# Patient Record
Sex: Female | Born: 1945
Health system: Southern US, Community
[De-identification: ages and names within clinical notes are randomized; demographics above are authoritative.]

## PROBLEM LIST (undated history)

## (undated) DIAGNOSIS — E785 Hyperlipidemia, unspecified: Secondary | ICD-10-CM

## (undated) DIAGNOSIS — I10 Essential (primary) hypertension: Secondary | ICD-10-CM

## (undated) DIAGNOSIS — M810 Age-related osteoporosis without current pathological fracture: Secondary | ICD-10-CM

## (undated) HISTORY — PX: TUMOR REMOVAL: SHX12

## (undated) HISTORY — DX: Hyperlipidemia, unspecified: E78.5

## (undated) HISTORY — DX: Age-related osteoporosis without current pathological fracture: M81.0

## (undated) HISTORY — DX: Essential (primary) hypertension: I10

---

## 2000-06-13 ENCOUNTER — Ambulatory Visit (HOSPITAL_COMMUNITY): Admission: RE | Admit: 2000-06-13 | Discharge: 2000-06-13 | Payer: Self-pay | Admitting: Gastroenterology

## 2010-04-30 HISTORY — PX: COLONOSCOPY: SHX174

## 2011-05-24 DIAGNOSIS — Z1231 Encounter for screening mammogram for malignant neoplasm of breast: Secondary | ICD-10-CM | POA: Diagnosis not present

## 2011-05-24 DIAGNOSIS — Z0000 Encounter for general adult medical examination without abnormal findings: Secondary | ICD-10-CM | POA: Diagnosis not present

## 2011-05-24 DIAGNOSIS — Z1210 Encounter for screening for malignant neoplasm of intestinal tract, unspecified: Secondary | ICD-10-CM | POA: Diagnosis not present

## 2011-05-24 DIAGNOSIS — Z01419 Encounter for gynecological examination (general) (routine) without abnormal findings: Secondary | ICD-10-CM | POA: Diagnosis not present

## 2011-05-29 DIAGNOSIS — Z1231 Encounter for screening mammogram for malignant neoplasm of breast: Secondary | ICD-10-CM | POA: Diagnosis not present

## 2011-07-02 DIAGNOSIS — Z0000 Encounter for general adult medical examination without abnormal findings: Secondary | ICD-10-CM | POA: Diagnosis not present

## 2011-07-25 DIAGNOSIS — Z1389 Encounter for screening for other disorder: Secondary | ICD-10-CM | POA: Diagnosis not present

## 2011-07-25 DIAGNOSIS — I10 Essential (primary) hypertension: Secondary | ICD-10-CM | POA: Diagnosis not present

## 2011-07-25 DIAGNOSIS — Z6827 Body mass index (BMI) 27.0-27.9, adult: Secondary | ICD-10-CM | POA: Diagnosis not present

## 2011-08-20 DIAGNOSIS — I10 Essential (primary) hypertension: Secondary | ICD-10-CM | POA: Diagnosis not present

## 2011-08-20 DIAGNOSIS — Z23 Encounter for immunization: Secondary | ICD-10-CM | POA: Diagnosis not present

## 2011-08-20 DIAGNOSIS — E782 Mixed hyperlipidemia: Secondary | ICD-10-CM | POA: Diagnosis not present

## 2012-01-16 DIAGNOSIS — E782 Mixed hyperlipidemia: Secondary | ICD-10-CM | POA: Diagnosis not present

## 2012-01-16 DIAGNOSIS — B009 Herpesviral infection, unspecified: Secondary | ICD-10-CM | POA: Diagnosis not present

## 2012-01-16 DIAGNOSIS — I10 Essential (primary) hypertension: Secondary | ICD-10-CM | POA: Diagnosis not present

## 2012-01-28 DIAGNOSIS — Z23 Encounter for immunization: Secondary | ICD-10-CM | POA: Diagnosis not present

## 2012-05-14 DIAGNOSIS — I10 Essential (primary) hypertension: Secondary | ICD-10-CM | POA: Diagnosis not present

## 2012-05-14 DIAGNOSIS — E782 Mixed hyperlipidemia: Secondary | ICD-10-CM | POA: Diagnosis not present

## 2012-05-14 DIAGNOSIS — E559 Vitamin D deficiency, unspecified: Secondary | ICD-10-CM | POA: Diagnosis not present

## 2012-05-14 DIAGNOSIS — L2089 Other atopic dermatitis: Secondary | ICD-10-CM | POA: Diagnosis not present

## 2012-05-20 DIAGNOSIS — M810 Age-related osteoporosis without current pathological fracture: Secondary | ICD-10-CM | POA: Diagnosis not present

## 2012-05-20 DIAGNOSIS — Z1231 Encounter for screening mammogram for malignant neoplasm of breast: Secondary | ICD-10-CM | POA: Diagnosis not present

## 2012-05-20 DIAGNOSIS — Z1210 Encounter for screening for malignant neoplasm of intestinal tract, unspecified: Secondary | ICD-10-CM | POA: Diagnosis not present

## 2012-05-20 DIAGNOSIS — Z01419 Encounter for gynecological examination (general) (routine) without abnormal findings: Secondary | ICD-10-CM | POA: Diagnosis not present

## 2012-05-20 DIAGNOSIS — N393 Stress incontinence (female) (male): Secondary | ICD-10-CM | POA: Diagnosis not present

## 2012-05-20 DIAGNOSIS — I10 Essential (primary) hypertension: Secondary | ICD-10-CM | POA: Diagnosis not present

## 2012-05-26 DIAGNOSIS — R05 Cough: Secondary | ICD-10-CM | POA: Diagnosis not present

## 2012-05-30 DIAGNOSIS — Z13820 Encounter for screening for osteoporosis: Secondary | ICD-10-CM | POA: Diagnosis not present

## 2012-05-30 DIAGNOSIS — M810 Age-related osteoporosis without current pathological fracture: Secondary | ICD-10-CM | POA: Diagnosis not present

## 2012-05-30 DIAGNOSIS — Z1231 Encounter for screening mammogram for malignant neoplasm of breast: Secondary | ICD-10-CM | POA: Diagnosis not present

## 2012-06-18 DIAGNOSIS — I10 Essential (primary) hypertension: Secondary | ICD-10-CM | POA: Diagnosis not present

## 2012-07-21 DIAGNOSIS — Q120 Congenital cataract: Secondary | ICD-10-CM | POA: Diagnosis not present

## 2012-11-12 DIAGNOSIS — E782 Mixed hyperlipidemia: Secondary | ICD-10-CM | POA: Diagnosis not present

## 2012-11-12 DIAGNOSIS — Z1331 Encounter for screening for depression: Secondary | ICD-10-CM | POA: Diagnosis not present

## 2012-11-12 DIAGNOSIS — Z139 Encounter for screening, unspecified: Secondary | ICD-10-CM | POA: Diagnosis not present

## 2012-11-12 DIAGNOSIS — I10 Essential (primary) hypertension: Secondary | ICD-10-CM | POA: Diagnosis not present

## 2013-02-03 DIAGNOSIS — Z79899 Other long term (current) drug therapy: Secondary | ICD-10-CM | POA: Diagnosis not present

## 2013-02-03 DIAGNOSIS — Z23 Encounter for immunization: Secondary | ICD-10-CM | POA: Diagnosis not present

## 2013-03-11 DIAGNOSIS — N182 Chronic kidney disease, stage 2 (mild): Secondary | ICD-10-CM | POA: Diagnosis not present

## 2013-03-11 DIAGNOSIS — Z1211 Encounter for screening for malignant neoplasm of colon: Secondary | ICD-10-CM | POA: Diagnosis not present

## 2013-03-11 DIAGNOSIS — I129 Hypertensive chronic kidney disease with stage 1 through stage 4 chronic kidney disease, or unspecified chronic kidney disease: Secondary | ICD-10-CM | POA: Diagnosis not present

## 2013-03-11 DIAGNOSIS — Z0000 Encounter for general adult medical examination without abnormal findings: Secondary | ICD-10-CM | POA: Diagnosis not present

## 2013-03-11 DIAGNOSIS — Z01419 Encounter for gynecological examination (general) (routine) without abnormal findings: Secondary | ICD-10-CM | POA: Diagnosis not present

## 2013-03-25 DIAGNOSIS — M810 Age-related osteoporosis without current pathological fracture: Secondary | ICD-10-CM | POA: Diagnosis not present

## 2013-06-03 DIAGNOSIS — Z1231 Encounter for screening mammogram for malignant neoplasm of breast: Secondary | ICD-10-CM | POA: Diagnosis not present

## 2013-06-03 DIAGNOSIS — R928 Other abnormal and inconclusive findings on diagnostic imaging of breast: Secondary | ICD-10-CM | POA: Diagnosis not present

## 2013-06-08 DIAGNOSIS — Z6827 Body mass index (BMI) 27.0-27.9, adult: Secondary | ICD-10-CM | POA: Diagnosis not present

## 2013-06-08 DIAGNOSIS — M67919 Unspecified disorder of synovium and tendon, unspecified shoulder: Secondary | ICD-10-CM | POA: Diagnosis not present

## 2013-06-08 DIAGNOSIS — N63 Unspecified lump in unspecified breast: Secondary | ICD-10-CM | POA: Diagnosis not present

## 2013-06-08 DIAGNOSIS — R928 Other abnormal and inconclusive findings on diagnostic imaging of breast: Secondary | ICD-10-CM | POA: Diagnosis not present

## 2013-06-11 DIAGNOSIS — I129 Hypertensive chronic kidney disease with stage 1 through stage 4 chronic kidney disease, or unspecified chronic kidney disease: Secondary | ICD-10-CM | POA: Diagnosis not present

## 2013-06-11 DIAGNOSIS — Z0000 Encounter for general adult medical examination without abnormal findings: Secondary | ICD-10-CM | POA: Diagnosis not present

## 2013-06-11 DIAGNOSIS — N182 Chronic kidney disease, stage 2 (mild): Secondary | ICD-10-CM | POA: Diagnosis not present

## 2013-06-11 DIAGNOSIS — M719 Bursopathy, unspecified: Secondary | ICD-10-CM | POA: Diagnosis not present

## 2013-06-11 DIAGNOSIS — Z1331 Encounter for screening for depression: Secondary | ICD-10-CM | POA: Diagnosis not present

## 2013-06-11 DIAGNOSIS — E782 Mixed hyperlipidemia: Secondary | ICD-10-CM | POA: Diagnosis not present

## 2013-06-11 DIAGNOSIS — Z139 Encounter for screening, unspecified: Secondary | ICD-10-CM | POA: Diagnosis not present

## 2013-06-11 DIAGNOSIS — M25519 Pain in unspecified shoulder: Secondary | ICD-10-CM | POA: Diagnosis not present

## 2013-06-11 DIAGNOSIS — M6281 Muscle weakness (generalized): Secondary | ICD-10-CM | POA: Diagnosis not present

## 2013-06-11 DIAGNOSIS — M67919 Unspecified disorder of synovium and tendon, unspecified shoulder: Secondary | ICD-10-CM | POA: Diagnosis not present

## 2013-06-19 DIAGNOSIS — M67919 Unspecified disorder of synovium and tendon, unspecified shoulder: Secondary | ICD-10-CM | POA: Diagnosis not present

## 2013-06-19 DIAGNOSIS — M25519 Pain in unspecified shoulder: Secondary | ICD-10-CM | POA: Diagnosis not present

## 2013-06-19 DIAGNOSIS — M6281 Muscle weakness (generalized): Secondary | ICD-10-CM | POA: Diagnosis not present

## 2013-06-24 DIAGNOSIS — M25519 Pain in unspecified shoulder: Secondary | ICD-10-CM | POA: Diagnosis not present

## 2013-06-24 DIAGNOSIS — M6281 Muscle weakness (generalized): Secondary | ICD-10-CM | POA: Diagnosis not present

## 2013-06-24 DIAGNOSIS — M67919 Unspecified disorder of synovium and tendon, unspecified shoulder: Secondary | ICD-10-CM | POA: Diagnosis not present

## 2013-06-29 DIAGNOSIS — M719 Bursopathy, unspecified: Secondary | ICD-10-CM | POA: Diagnosis not present

## 2013-06-29 DIAGNOSIS — M25519 Pain in unspecified shoulder: Secondary | ICD-10-CM | POA: Diagnosis not present

## 2013-06-29 DIAGNOSIS — M6281 Muscle weakness (generalized): Secondary | ICD-10-CM | POA: Diagnosis not present

## 2013-06-29 DIAGNOSIS — M67919 Unspecified disorder of synovium and tendon, unspecified shoulder: Secondary | ICD-10-CM | POA: Diagnosis not present

## 2013-07-03 DIAGNOSIS — M6281 Muscle weakness (generalized): Secondary | ICD-10-CM | POA: Diagnosis not present

## 2013-07-03 DIAGNOSIS — M719 Bursopathy, unspecified: Secondary | ICD-10-CM | POA: Diagnosis not present

## 2013-07-03 DIAGNOSIS — M25519 Pain in unspecified shoulder: Secondary | ICD-10-CM | POA: Diagnosis not present

## 2013-07-03 DIAGNOSIS — M67919 Unspecified disorder of synovium and tendon, unspecified shoulder: Secondary | ICD-10-CM | POA: Diagnosis not present

## 2013-07-06 DIAGNOSIS — M6281 Muscle weakness (generalized): Secondary | ICD-10-CM | POA: Diagnosis not present

## 2013-07-06 DIAGNOSIS — M67919 Unspecified disorder of synovium and tendon, unspecified shoulder: Secondary | ICD-10-CM | POA: Diagnosis not present

## 2013-07-06 DIAGNOSIS — M25519 Pain in unspecified shoulder: Secondary | ICD-10-CM | POA: Diagnosis not present

## 2013-07-06 DIAGNOSIS — M719 Bursopathy, unspecified: Secondary | ICD-10-CM | POA: Diagnosis not present

## 2013-10-01 DIAGNOSIS — N182 Chronic kidney disease, stage 2 (mild): Secondary | ICD-10-CM | POA: Diagnosis not present

## 2013-10-01 DIAGNOSIS — I129 Hypertensive chronic kidney disease with stage 1 through stage 4 chronic kidney disease, or unspecified chronic kidney disease: Secondary | ICD-10-CM | POA: Diagnosis not present

## 2013-10-01 DIAGNOSIS — E782 Mixed hyperlipidemia: Secondary | ICD-10-CM | POA: Diagnosis not present

## 2013-10-19 DIAGNOSIS — I129 Hypertensive chronic kidney disease with stage 1 through stage 4 chronic kidney disease, or unspecified chronic kidney disease: Secondary | ICD-10-CM | POA: Diagnosis not present

## 2013-10-19 DIAGNOSIS — Z6827 Body mass index (BMI) 27.0-27.9, adult: Secondary | ICD-10-CM | POA: Diagnosis not present

## 2013-10-19 DIAGNOSIS — E782 Mixed hyperlipidemia: Secondary | ICD-10-CM | POA: Diagnosis not present

## 2013-10-19 DIAGNOSIS — N182 Chronic kidney disease, stage 2 (mild): Secondary | ICD-10-CM | POA: Diagnosis not present

## 2013-11-06 DIAGNOSIS — J029 Acute pharyngitis, unspecified: Secondary | ICD-10-CM | POA: Diagnosis not present

## 2013-11-06 DIAGNOSIS — I10 Essential (primary) hypertension: Secondary | ICD-10-CM | POA: Diagnosis not present

## 2013-11-06 DIAGNOSIS — Z6827 Body mass index (BMI) 27.0-27.9, adult: Secondary | ICD-10-CM | POA: Diagnosis not present

## 2013-12-07 DIAGNOSIS — R922 Inconclusive mammogram: Secondary | ICD-10-CM | POA: Diagnosis not present

## 2013-12-07 DIAGNOSIS — R928 Other abnormal and inconclusive findings on diagnostic imaging of breast: Secondary | ICD-10-CM | POA: Diagnosis not present

## 2014-02-18 DIAGNOSIS — Z23 Encounter for immunization: Secondary | ICD-10-CM | POA: Diagnosis not present

## 2014-02-18 DIAGNOSIS — E785 Hyperlipidemia, unspecified: Secondary | ICD-10-CM | POA: Diagnosis not present

## 2014-02-18 DIAGNOSIS — I129 Hypertensive chronic kidney disease with stage 1 through stage 4 chronic kidney disease, or unspecified chronic kidney disease: Secondary | ICD-10-CM | POA: Diagnosis not present

## 2014-02-18 DIAGNOSIS — N182 Chronic kidney disease, stage 2 (mild): Secondary | ICD-10-CM | POA: Diagnosis not present

## 2014-02-25 DIAGNOSIS — I129 Hypertensive chronic kidney disease with stage 1 through stage 4 chronic kidney disease, or unspecified chronic kidney disease: Secondary | ICD-10-CM | POA: Diagnosis not present

## 2014-02-25 DIAGNOSIS — E785 Hyperlipidemia, unspecified: Secondary | ICD-10-CM | POA: Diagnosis not present

## 2014-02-25 DIAGNOSIS — Z6827 Body mass index (BMI) 27.0-27.9, adult: Secondary | ICD-10-CM | POA: Diagnosis not present

## 2014-02-25 DIAGNOSIS — N182 Chronic kidney disease, stage 2 (mild): Secondary | ICD-10-CM | POA: Diagnosis not present

## 2014-06-30 DIAGNOSIS — R928 Other abnormal and inconclusive findings on diagnostic imaging of breast: Secondary | ICD-10-CM | POA: Diagnosis not present

## 2014-06-30 DIAGNOSIS — R922 Inconclusive mammogram: Secondary | ICD-10-CM | POA: Diagnosis not present

## 2014-07-20 DIAGNOSIS — H2513 Age-related nuclear cataract, bilateral: Secondary | ICD-10-CM | POA: Diagnosis not present

## 2014-07-26 DIAGNOSIS — N182 Chronic kidney disease, stage 2 (mild): Secondary | ICD-10-CM | POA: Diagnosis not present

## 2014-07-26 DIAGNOSIS — Z6827 Body mass index (BMI) 27.0-27.9, adult: Secondary | ICD-10-CM | POA: Diagnosis not present

## 2014-07-26 DIAGNOSIS — E785 Hyperlipidemia, unspecified: Secondary | ICD-10-CM | POA: Diagnosis not present

## 2014-07-26 DIAGNOSIS — I129 Hypertensive chronic kidney disease with stage 1 through stage 4 chronic kidney disease, or unspecified chronic kidney disease: Secondary | ICD-10-CM | POA: Diagnosis not present

## 2014-10-28 DIAGNOSIS — K644 Residual hemorrhoidal skin tags: Secondary | ICD-10-CM | POA: Diagnosis not present

## 2014-10-28 DIAGNOSIS — Z6827 Body mass index (BMI) 27.0-27.9, adult: Secondary | ICD-10-CM | POA: Diagnosis not present

## 2014-11-17 DIAGNOSIS — Z139 Encounter for screening, unspecified: Secondary | ICD-10-CM | POA: Diagnosis not present

## 2014-11-17 DIAGNOSIS — Z0000 Encounter for general adult medical examination without abnormal findings: Secondary | ICD-10-CM | POA: Diagnosis not present

## 2014-11-17 DIAGNOSIS — Z1389 Encounter for screening for other disorder: Secondary | ICD-10-CM | POA: Diagnosis not present

## 2014-12-30 DIAGNOSIS — N182 Chronic kidney disease, stage 2 (mild): Secondary | ICD-10-CM | POA: Diagnosis not present

## 2014-12-30 DIAGNOSIS — E785 Hyperlipidemia, unspecified: Secondary | ICD-10-CM | POA: Diagnosis not present

## 2014-12-30 DIAGNOSIS — I129 Hypertensive chronic kidney disease with stage 1 through stage 4 chronic kidney disease, or unspecified chronic kidney disease: Secondary | ICD-10-CM | POA: Diagnosis not present

## 2014-12-30 DIAGNOSIS — K219 Gastro-esophageal reflux disease without esophagitis: Secondary | ICD-10-CM | POA: Diagnosis not present

## 2015-03-07 DIAGNOSIS — Z23 Encounter for immunization: Secondary | ICD-10-CM | POA: Diagnosis not present

## 2015-03-07 DIAGNOSIS — R200 Anesthesia of skin: Secondary | ICD-10-CM | POA: Diagnosis not present

## 2015-03-07 DIAGNOSIS — R0602 Shortness of breath: Secondary | ICD-10-CM | POA: Diagnosis not present

## 2015-03-07 DIAGNOSIS — Z6828 Body mass index (BMI) 28.0-28.9, adult: Secondary | ICD-10-CM | POA: Diagnosis not present

## 2015-05-09 DIAGNOSIS — Z6828 Body mass index (BMI) 28.0-28.9, adult: Secondary | ICD-10-CM | POA: Diagnosis not present

## 2015-05-09 DIAGNOSIS — J029 Acute pharyngitis, unspecified: Secondary | ICD-10-CM | POA: Diagnosis not present

## 2015-05-18 DIAGNOSIS — N182 Chronic kidney disease, stage 2 (mild): Secondary | ICD-10-CM | POA: Diagnosis not present

## 2015-05-18 DIAGNOSIS — E785 Hyperlipidemia, unspecified: Secondary | ICD-10-CM | POA: Diagnosis not present

## 2015-05-18 DIAGNOSIS — I129 Hypertensive chronic kidney disease with stage 1 through stage 4 chronic kidney disease, or unspecified chronic kidney disease: Secondary | ICD-10-CM | POA: Diagnosis not present

## 2015-05-27 DIAGNOSIS — E785 Hyperlipidemia, unspecified: Secondary | ICD-10-CM | POA: Diagnosis not present

## 2015-05-27 DIAGNOSIS — M810 Age-related osteoporosis without current pathological fracture: Secondary | ICD-10-CM | POA: Diagnosis not present

## 2015-05-27 DIAGNOSIS — Z6828 Body mass index (BMI) 28.0-28.9, adult: Secondary | ICD-10-CM | POA: Diagnosis not present

## 2015-05-27 DIAGNOSIS — N182 Chronic kidney disease, stage 2 (mild): Secondary | ICD-10-CM | POA: Diagnosis not present

## 2015-05-27 DIAGNOSIS — I129 Hypertensive chronic kidney disease with stage 1 through stage 4 chronic kidney disease, or unspecified chronic kidney disease: Secondary | ICD-10-CM | POA: Diagnosis not present

## 2015-06-08 DIAGNOSIS — M8589 Other specified disorders of bone density and structure, multiple sites: Secondary | ICD-10-CM | POA: Diagnosis not present

## 2015-06-08 DIAGNOSIS — M818 Other osteoporosis without current pathological fracture: Secondary | ICD-10-CM | POA: Diagnosis not present

## 2015-07-04 DIAGNOSIS — R928 Other abnormal and inconclusive findings on diagnostic imaging of breast: Secondary | ICD-10-CM | POA: Diagnosis not present

## 2015-07-04 DIAGNOSIS — N63 Unspecified lump in breast: Secondary | ICD-10-CM | POA: Diagnosis not present

## 2015-07-04 DIAGNOSIS — N6489 Other specified disorders of breast: Secondary | ICD-10-CM | POA: Diagnosis not present

## 2015-07-07 DIAGNOSIS — Z6828 Body mass index (BMI) 28.0-28.9, adult: Secondary | ICD-10-CM | POA: Diagnosis not present

## 2015-07-07 DIAGNOSIS — R21 Rash and other nonspecific skin eruption: Secondary | ICD-10-CM | POA: Diagnosis not present

## 2015-07-22 DIAGNOSIS — E785 Hyperlipidemia, unspecified: Secondary | ICD-10-CM | POA: Diagnosis not present

## 2015-07-22 DIAGNOSIS — R21 Rash and other nonspecific skin eruption: Secondary | ICD-10-CM | POA: Diagnosis not present

## 2015-07-22 DIAGNOSIS — Z6828 Body mass index (BMI) 28.0-28.9, adult: Secondary | ICD-10-CM | POA: Diagnosis not present

## 2015-08-25 DIAGNOSIS — E785 Hyperlipidemia, unspecified: Secondary | ICD-10-CM | POA: Diagnosis not present

## 2015-08-25 DIAGNOSIS — I129 Hypertensive chronic kidney disease with stage 1 through stage 4 chronic kidney disease, or unspecified chronic kidney disease: Secondary | ICD-10-CM | POA: Diagnosis not present

## 2015-08-25 DIAGNOSIS — N182 Chronic kidney disease, stage 2 (mild): Secondary | ICD-10-CM | POA: Diagnosis not present

## 2015-08-25 DIAGNOSIS — M810 Age-related osteoporosis without current pathological fracture: Secondary | ICD-10-CM | POA: Diagnosis not present

## 2015-10-07 DIAGNOSIS — L719 Rosacea, unspecified: Secondary | ICD-10-CM | POA: Diagnosis not present

## 2015-10-12 DIAGNOSIS — I129 Hypertensive chronic kidney disease with stage 1 through stage 4 chronic kidney disease, or unspecified chronic kidney disease: Secondary | ICD-10-CM | POA: Diagnosis not present

## 2015-10-12 DIAGNOSIS — E785 Hyperlipidemia, unspecified: Secondary | ICD-10-CM | POA: Diagnosis not present

## 2015-10-12 DIAGNOSIS — N182 Chronic kidney disease, stage 2 (mild): Secondary | ICD-10-CM | POA: Diagnosis not present

## 2015-10-24 DIAGNOSIS — E785 Hyperlipidemia, unspecified: Secondary | ICD-10-CM | POA: Diagnosis not present

## 2015-10-24 DIAGNOSIS — Z139 Encounter for screening, unspecified: Secondary | ICD-10-CM | POA: Diagnosis not present

## 2015-10-24 DIAGNOSIS — Z7189 Other specified counseling: Secondary | ICD-10-CM | POA: Diagnosis not present

## 2015-10-24 DIAGNOSIS — Z9181 History of falling: Secondary | ICD-10-CM | POA: Diagnosis not present

## 2015-10-24 DIAGNOSIS — Z0000 Encounter for general adult medical examination without abnormal findings: Secondary | ICD-10-CM | POA: Diagnosis not present

## 2015-10-24 DIAGNOSIS — Z1389 Encounter for screening for other disorder: Secondary | ICD-10-CM | POA: Diagnosis not present

## 2015-10-24 DIAGNOSIS — N182 Chronic kidney disease, stage 2 (mild): Secondary | ICD-10-CM | POA: Diagnosis not present

## 2015-10-24 DIAGNOSIS — I129 Hypertensive chronic kidney disease with stage 1 through stage 4 chronic kidney disease, or unspecified chronic kidney disease: Secondary | ICD-10-CM | POA: Diagnosis not present

## 2016-01-03 ENCOUNTER — Other Ambulatory Visit: Payer: Self-pay

## 2016-03-08 DIAGNOSIS — I129 Hypertensive chronic kidney disease with stage 1 through stage 4 chronic kidney disease, or unspecified chronic kidney disease: Secondary | ICD-10-CM | POA: Diagnosis not present

## 2016-03-08 DIAGNOSIS — E785 Hyperlipidemia, unspecified: Secondary | ICD-10-CM | POA: Diagnosis not present

## 2016-03-12 DIAGNOSIS — M810 Age-related osteoporosis without current pathological fracture: Secondary | ICD-10-CM | POA: Diagnosis not present

## 2016-03-12 DIAGNOSIS — N182 Chronic kidney disease, stage 2 (mild): Secondary | ICD-10-CM | POA: Diagnosis not present

## 2016-03-12 DIAGNOSIS — E785 Hyperlipidemia, unspecified: Secondary | ICD-10-CM | POA: Diagnosis not present

## 2016-03-12 DIAGNOSIS — I129 Hypertensive chronic kidney disease with stage 1 through stage 4 chronic kidney disease, or unspecified chronic kidney disease: Secondary | ICD-10-CM | POA: Diagnosis not present

## 2016-03-16 DIAGNOSIS — M810 Age-related osteoporosis without current pathological fracture: Secondary | ICD-10-CM | POA: Diagnosis not present

## 2016-03-16 DIAGNOSIS — Z6827 Body mass index (BMI) 27.0-27.9, adult: Secondary | ICD-10-CM | POA: Diagnosis not present

## 2016-03-16 DIAGNOSIS — I129 Hypertensive chronic kidney disease with stage 1 through stage 4 chronic kidney disease, or unspecified chronic kidney disease: Secondary | ICD-10-CM | POA: Diagnosis not present

## 2016-03-16 DIAGNOSIS — N182 Chronic kidney disease, stage 2 (mild): Secondary | ICD-10-CM | POA: Diagnosis not present

## 2016-03-16 DIAGNOSIS — Z01419 Encounter for gynecological examination (general) (routine) without abnormal findings: Secondary | ICD-10-CM | POA: Diagnosis not present

## 2016-07-25 DIAGNOSIS — Z1231 Encounter for screening mammogram for malignant neoplasm of breast: Secondary | ICD-10-CM | POA: Diagnosis not present

## 2016-07-26 DIAGNOSIS — H02831 Dermatochalasis of right upper eyelid: Secondary | ICD-10-CM | POA: Diagnosis not present

## 2016-07-26 DIAGNOSIS — H02834 Dermatochalasis of left upper eyelid: Secondary | ICD-10-CM | POA: Diagnosis not present

## 2016-07-27 DIAGNOSIS — Z6827 Body mass index (BMI) 27.0-27.9, adult: Secondary | ICD-10-CM | POA: Diagnosis not present

## 2016-07-27 DIAGNOSIS — R1030 Lower abdominal pain, unspecified: Secondary | ICD-10-CM | POA: Diagnosis not present

## 2016-07-27 DIAGNOSIS — M810 Age-related osteoporosis without current pathological fracture: Secondary | ICD-10-CM | POA: Diagnosis not present

## 2016-08-01 DIAGNOSIS — M62552 Muscle wasting and atrophy, not elsewhere classified, left thigh: Secondary | ICD-10-CM | POA: Diagnosis not present

## 2016-08-01 DIAGNOSIS — M25652 Stiffness of left hip, not elsewhere classified: Secondary | ICD-10-CM | POA: Diagnosis not present

## 2016-08-01 DIAGNOSIS — M25552 Pain in left hip: Secondary | ICD-10-CM | POA: Diagnosis not present

## 2016-08-01 DIAGNOSIS — M25452 Effusion, left hip: Secondary | ICD-10-CM | POA: Diagnosis not present

## 2016-08-02 DIAGNOSIS — M25652 Stiffness of left hip, not elsewhere classified: Secondary | ICD-10-CM | POA: Diagnosis not present

## 2016-08-02 DIAGNOSIS — M25452 Effusion, left hip: Secondary | ICD-10-CM | POA: Diagnosis not present

## 2016-08-02 DIAGNOSIS — M25552 Pain in left hip: Secondary | ICD-10-CM | POA: Diagnosis not present

## 2016-08-02 DIAGNOSIS — M62552 Muscle wasting and atrophy, not elsewhere classified, left thigh: Secondary | ICD-10-CM | POA: Diagnosis not present

## 2016-08-06 DIAGNOSIS — M25552 Pain in left hip: Secondary | ICD-10-CM | POA: Diagnosis not present

## 2016-08-06 DIAGNOSIS — M25452 Effusion, left hip: Secondary | ICD-10-CM | POA: Diagnosis not present

## 2016-08-06 DIAGNOSIS — M62552 Muscle wasting and atrophy, not elsewhere classified, left thigh: Secondary | ICD-10-CM | POA: Diagnosis not present

## 2016-08-06 DIAGNOSIS — M25652 Stiffness of left hip, not elsewhere classified: Secondary | ICD-10-CM | POA: Diagnosis not present

## 2016-08-08 DIAGNOSIS — M25552 Pain in left hip: Secondary | ICD-10-CM | POA: Diagnosis not present

## 2016-08-08 DIAGNOSIS — M25452 Effusion, left hip: Secondary | ICD-10-CM | POA: Diagnosis not present

## 2016-08-08 DIAGNOSIS — M25652 Stiffness of left hip, not elsewhere classified: Secondary | ICD-10-CM | POA: Diagnosis not present

## 2016-08-08 DIAGNOSIS — M62552 Muscle wasting and atrophy, not elsewhere classified, left thigh: Secondary | ICD-10-CM | POA: Diagnosis not present

## 2016-08-15 DIAGNOSIS — G8911 Acute pain due to trauma: Secondary | ICD-10-CM | POA: Diagnosis not present

## 2016-08-15 DIAGNOSIS — S20212A Contusion of left front wall of thorax, initial encounter: Secondary | ICD-10-CM | POA: Diagnosis not present

## 2016-08-15 DIAGNOSIS — S299XXA Unspecified injury of thorax, initial encounter: Secondary | ICD-10-CM | POA: Diagnosis not present

## 2016-09-12 DIAGNOSIS — E785 Hyperlipidemia, unspecified: Secondary | ICD-10-CM | POA: Diagnosis not present

## 2016-09-12 DIAGNOSIS — I129 Hypertensive chronic kidney disease with stage 1 through stage 4 chronic kidney disease, or unspecified chronic kidney disease: Secondary | ICD-10-CM | POA: Diagnosis not present

## 2016-09-20 DIAGNOSIS — H2513 Age-related nuclear cataract, bilateral: Secondary | ICD-10-CM | POA: Diagnosis not present

## 2016-09-27 DIAGNOSIS — I129 Hypertensive chronic kidney disease with stage 1 through stage 4 chronic kidney disease, or unspecified chronic kidney disease: Secondary | ICD-10-CM | POA: Diagnosis not present

## 2016-09-27 DIAGNOSIS — L821 Other seborrheic keratosis: Secondary | ICD-10-CM | POA: Diagnosis not present

## 2016-09-27 DIAGNOSIS — N182 Chronic kidney disease, stage 2 (mild): Secondary | ICD-10-CM | POA: Diagnosis not present

## 2016-09-27 DIAGNOSIS — E785 Hyperlipidemia, unspecified: Secondary | ICD-10-CM | POA: Diagnosis not present

## 2017-02-21 DIAGNOSIS — I129 Hypertensive chronic kidney disease with stage 1 through stage 4 chronic kidney disease, or unspecified chronic kidney disease: Secondary | ICD-10-CM | POA: Diagnosis not present

## 2017-02-21 DIAGNOSIS — E785 Hyperlipidemia, unspecified: Secondary | ICD-10-CM | POA: Diagnosis not present

## 2017-03-08 DIAGNOSIS — Z1331 Encounter for screening for depression: Secondary | ICD-10-CM | POA: Diagnosis not present

## 2017-03-08 DIAGNOSIS — Z139 Encounter for screening, unspecified: Secondary | ICD-10-CM | POA: Diagnosis not present

## 2017-03-08 DIAGNOSIS — Z6825 Body mass index (BMI) 25.0-25.9, adult: Secondary | ICD-10-CM | POA: Diagnosis not present

## 2017-03-08 DIAGNOSIS — E785 Hyperlipidemia, unspecified: Secondary | ICD-10-CM | POA: Diagnosis not present

## 2017-03-08 DIAGNOSIS — Z0000 Encounter for general adult medical examination without abnormal findings: Secondary | ICD-10-CM | POA: Diagnosis not present

## 2017-03-08 DIAGNOSIS — I129 Hypertensive chronic kidney disease with stage 1 through stage 4 chronic kidney disease, or unspecified chronic kidney disease: Secondary | ICD-10-CM | POA: Diagnosis not present

## 2017-03-08 DIAGNOSIS — N182 Chronic kidney disease, stage 2 (mild): Secondary | ICD-10-CM | POA: Diagnosis not present

## 2017-07-31 DIAGNOSIS — E785 Hyperlipidemia, unspecified: Secondary | ICD-10-CM | POA: Diagnosis not present

## 2017-07-31 DIAGNOSIS — I129 Hypertensive chronic kidney disease with stage 1 through stage 4 chronic kidney disease, or unspecified chronic kidney disease: Secondary | ICD-10-CM | POA: Diagnosis not present

## 2017-08-06 DIAGNOSIS — Z1231 Encounter for screening mammogram for malignant neoplasm of breast: Secondary | ICD-10-CM | POA: Diagnosis not present

## 2017-08-08 DIAGNOSIS — M810 Age-related osteoporosis without current pathological fracture: Secondary | ICD-10-CM | POA: Diagnosis not present

## 2017-08-08 DIAGNOSIS — E785 Hyperlipidemia, unspecified: Secondary | ICD-10-CM | POA: Diagnosis not present

## 2017-08-08 DIAGNOSIS — N182 Chronic kidney disease, stage 2 (mild): Secondary | ICD-10-CM | POA: Diagnosis not present

## 2017-08-08 DIAGNOSIS — I129 Hypertensive chronic kidney disease with stage 1 through stage 4 chronic kidney disease, or unspecified chronic kidney disease: Secondary | ICD-10-CM | POA: Diagnosis not present

## 2017-11-27 ENCOUNTER — Other Ambulatory Visit: Payer: Self-pay

## 2017-12-27 DIAGNOSIS — Z23 Encounter for immunization: Secondary | ICD-10-CM | POA: Diagnosis not present

## 2017-12-27 DIAGNOSIS — B351 Tinea unguium: Secondary | ICD-10-CM | POA: Diagnosis not present

## 2017-12-27 DIAGNOSIS — B078 Other viral warts: Secondary | ICD-10-CM | POA: Diagnosis not present

## 2017-12-27 DIAGNOSIS — Z139 Encounter for screening, unspecified: Secondary | ICD-10-CM | POA: Diagnosis not present

## 2018-01-30 DIAGNOSIS — I129 Hypertensive chronic kidney disease with stage 1 through stage 4 chronic kidney disease, or unspecified chronic kidney disease: Secondary | ICD-10-CM | POA: Diagnosis not present

## 2018-01-30 DIAGNOSIS — E785 Hyperlipidemia, unspecified: Secondary | ICD-10-CM | POA: Diagnosis not present

## 2018-02-10 DIAGNOSIS — N182 Chronic kidney disease, stage 2 (mild): Secondary | ICD-10-CM | POA: Diagnosis not present

## 2018-02-10 DIAGNOSIS — E785 Hyperlipidemia, unspecified: Secondary | ICD-10-CM | POA: Diagnosis not present

## 2018-02-10 DIAGNOSIS — Z139 Encounter for screening, unspecified: Secondary | ICD-10-CM | POA: Diagnosis not present

## 2018-02-10 DIAGNOSIS — Z7189 Other specified counseling: Secondary | ICD-10-CM | POA: Diagnosis not present

## 2018-02-10 DIAGNOSIS — I129 Hypertensive chronic kidney disease with stage 1 through stage 4 chronic kidney disease, or unspecified chronic kidney disease: Secondary | ICD-10-CM | POA: Diagnosis not present

## 2018-03-18 ENCOUNTER — Other Ambulatory Visit: Payer: Self-pay

## 2018-04-29 DIAGNOSIS — J111 Influenza due to unidentified influenza virus with other respiratory manifestations: Secondary | ICD-10-CM | POA: Diagnosis not present

## 2018-05-09 DIAGNOSIS — Z139 Encounter for screening, unspecified: Secondary | ICD-10-CM | POA: Diagnosis not present

## 2018-05-09 DIAGNOSIS — J101 Influenza due to other identified influenza virus with other respiratory manifestations: Secondary | ICD-10-CM | POA: Diagnosis not present

## 2018-05-09 DIAGNOSIS — Z6825 Body mass index (BMI) 25.0-25.9, adult: Secondary | ICD-10-CM | POA: Diagnosis not present

## 2018-05-09 DIAGNOSIS — H6123 Impacted cerumen, bilateral: Secondary | ICD-10-CM | POA: Diagnosis not present

## 2018-05-12 DIAGNOSIS — H6123 Impacted cerumen, bilateral: Secondary | ICD-10-CM | POA: Diagnosis not present

## 2018-06-06 DIAGNOSIS — I129 Hypertensive chronic kidney disease with stage 1 through stage 4 chronic kidney disease, or unspecified chronic kidney disease: Secondary | ICD-10-CM | POA: Diagnosis not present

## 2018-06-06 DIAGNOSIS — E785 Hyperlipidemia, unspecified: Secondary | ICD-10-CM | POA: Diagnosis not present

## 2018-06-13 DIAGNOSIS — N182 Chronic kidney disease, stage 2 (mild): Secondary | ICD-10-CM | POA: Diagnosis not present

## 2018-06-13 DIAGNOSIS — I129 Hypertensive chronic kidney disease with stage 1 through stage 4 chronic kidney disease, or unspecified chronic kidney disease: Secondary | ICD-10-CM | POA: Diagnosis not present

## 2018-06-13 DIAGNOSIS — Z139 Encounter for screening, unspecified: Secondary | ICD-10-CM | POA: Diagnosis not present

## 2018-06-13 DIAGNOSIS — E785 Hyperlipidemia, unspecified: Secondary | ICD-10-CM | POA: Diagnosis not present

## 2018-09-18 DIAGNOSIS — Z0000 Encounter for general adult medical examination without abnormal findings: Secondary | ICD-10-CM | POA: Diagnosis not present

## 2018-09-18 DIAGNOSIS — Z139 Encounter for screening, unspecified: Secondary | ICD-10-CM | POA: Diagnosis not present

## 2018-09-18 DIAGNOSIS — N644 Mastodynia: Secondary | ICD-10-CM | POA: Diagnosis not present

## 2018-09-18 DIAGNOSIS — Z1331 Encounter for screening for depression: Secondary | ICD-10-CM | POA: Diagnosis not present

## 2018-09-19 ENCOUNTER — Other Ambulatory Visit: Payer: Self-pay | Admitting: Family Medicine

## 2018-09-19 DIAGNOSIS — R5381 Other malaise: Secondary | ICD-10-CM

## 2018-09-26 ENCOUNTER — Other Ambulatory Visit: Payer: Self-pay | Admitting: Family Medicine

## 2018-09-26 DIAGNOSIS — N644 Mastodynia: Secondary | ICD-10-CM

## 2018-09-30 ENCOUNTER — Ambulatory Visit
Admission: RE | Admit: 2018-09-30 | Discharge: 2018-09-30 | Disposition: A | Discharge status: Home / Self Care | Payer: Medicare Other | Source: Ambulatory Visit | Attending: Family Medicine | Admitting: Family Medicine

## 2018-09-30 ENCOUNTER — Other Ambulatory Visit: Payer: Self-pay

## 2018-09-30 ENCOUNTER — Ambulatory Visit: Payer: Self-pay

## 2018-09-30 DIAGNOSIS — N644 Mastodynia: Secondary | ICD-10-CM

## 2018-09-30 DIAGNOSIS — R922 Inconclusive mammogram: Secondary | ICD-10-CM | POA: Diagnosis not present

## 2018-10-03 DIAGNOSIS — E785 Hyperlipidemia, unspecified: Secondary | ICD-10-CM | POA: Diagnosis not present

## 2018-10-03 DIAGNOSIS — Z01419 Encounter for gynecological examination (general) (routine) without abnormal findings: Secondary | ICD-10-CM | POA: Diagnosis not present

## 2018-10-03 DIAGNOSIS — I129 Hypertensive chronic kidney disease with stage 1 through stage 4 chronic kidney disease, or unspecified chronic kidney disease: Secondary | ICD-10-CM | POA: Diagnosis not present

## 2018-10-03 DIAGNOSIS — N182 Chronic kidney disease, stage 2 (mild): Secondary | ICD-10-CM | POA: Diagnosis not present

## 2018-10-03 DIAGNOSIS — Z1211 Encounter for screening for malignant neoplasm of colon: Secondary | ICD-10-CM | POA: Diagnosis not present

## 2018-10-03 DIAGNOSIS — R0789 Other chest pain: Secondary | ICD-10-CM | POA: Diagnosis not present

## 2018-10-03 DIAGNOSIS — Z139 Encounter for screening, unspecified: Secondary | ICD-10-CM | POA: Diagnosis not present

## 2018-11-05 DIAGNOSIS — E785 Hyperlipidemia, unspecified: Secondary | ICD-10-CM | POA: Diagnosis not present

## 2018-11-05 DIAGNOSIS — I129 Hypertensive chronic kidney disease with stage 1 through stage 4 chronic kidney disease, or unspecified chronic kidney disease: Secondary | ICD-10-CM | POA: Diagnosis not present

## 2018-11-13 DIAGNOSIS — M810 Age-related osteoporosis without current pathological fracture: Secondary | ICD-10-CM | POA: Diagnosis not present

## 2018-11-13 DIAGNOSIS — E785 Hyperlipidemia, unspecified: Secondary | ICD-10-CM | POA: Diagnosis not present

## 2018-11-13 DIAGNOSIS — N182 Chronic kidney disease, stage 2 (mild): Secondary | ICD-10-CM | POA: Diagnosis not present

## 2018-11-13 DIAGNOSIS — I129 Hypertensive chronic kidney disease with stage 1 through stage 4 chronic kidney disease, or unspecified chronic kidney disease: Secondary | ICD-10-CM | POA: Diagnosis not present

## 2019-01-02 DIAGNOSIS — T171XXA Foreign body in nostril, initial encounter: Secondary | ICD-10-CM | POA: Diagnosis not present

## 2019-01-07 DIAGNOSIS — J029 Acute pharyngitis, unspecified: Secondary | ICD-10-CM | POA: Diagnosis not present

## 2019-01-07 DIAGNOSIS — Z6825 Body mass index (BMI) 25.0-25.9, adult: Secondary | ICD-10-CM | POA: Diagnosis not present

## 2019-01-28 DIAGNOSIS — E2839 Other primary ovarian failure: Secondary | ICD-10-CM | POA: Diagnosis not present

## 2019-01-28 DIAGNOSIS — M8589 Other specified disorders of bone density and structure, multiple sites: Secondary | ICD-10-CM | POA: Diagnosis not present

## 2019-01-28 DIAGNOSIS — Z23 Encounter for immunization: Secondary | ICD-10-CM | POA: Diagnosis not present

## 2019-02-02 DIAGNOSIS — L209 Atopic dermatitis, unspecified: Secondary | ICD-10-CM | POA: Diagnosis not present

## 2019-02-02 DIAGNOSIS — L299 Pruritus, unspecified: Secondary | ICD-10-CM | POA: Diagnosis not present

## 2019-04-08 DIAGNOSIS — Z131 Encounter for screening for diabetes mellitus: Secondary | ICD-10-CM | POA: Diagnosis not present

## 2019-04-08 DIAGNOSIS — D649 Anemia, unspecified: Secondary | ICD-10-CM | POA: Diagnosis not present

## 2019-04-08 DIAGNOSIS — I129 Hypertensive chronic kidney disease with stage 1 through stage 4 chronic kidney disease, or unspecified chronic kidney disease: Secondary | ICD-10-CM | POA: Diagnosis not present

## 2019-04-08 DIAGNOSIS — E785 Hyperlipidemia, unspecified: Secondary | ICD-10-CM | POA: Diagnosis not present

## 2019-05-30 DIAGNOSIS — Z23 Encounter for immunization: Secondary | ICD-10-CM | POA: Diagnosis not present

## 2019-06-27 DIAGNOSIS — Z23 Encounter for immunization: Secondary | ICD-10-CM | POA: Diagnosis not present

## 2019-08-12 DIAGNOSIS — E785 Hyperlipidemia, unspecified: Secondary | ICD-10-CM | POA: Diagnosis not present

## 2019-08-14 DIAGNOSIS — R600 Localized edema: Secondary | ICD-10-CM | POA: Diagnosis not present

## 2019-08-14 DIAGNOSIS — Z6826 Body mass index (BMI) 26.0-26.9, adult: Secondary | ICD-10-CM | POA: Diagnosis not present

## 2019-08-17 DIAGNOSIS — Z6826 Body mass index (BMI) 26.0-26.9, adult: Secondary | ICD-10-CM | POA: Diagnosis not present

## 2019-08-17 DIAGNOSIS — I129 Hypertensive chronic kidney disease with stage 1 through stage 4 chronic kidney disease, or unspecified chronic kidney disease: Secondary | ICD-10-CM | POA: Diagnosis not present

## 2019-08-17 DIAGNOSIS — Z9109 Other allergy status, other than to drugs and biological substances: Secondary | ICD-10-CM | POA: Diagnosis not present

## 2019-08-17 DIAGNOSIS — N182 Chronic kidney disease, stage 2 (mild): Secondary | ICD-10-CM | POA: Diagnosis not present

## 2019-08-31 ENCOUNTER — Other Ambulatory Visit: Payer: Self-pay | Admitting: Family Medicine

## 2019-08-31 DIAGNOSIS — Z1231 Encounter for screening mammogram for malignant neoplasm of breast: Secondary | ICD-10-CM

## 2019-10-01 DIAGNOSIS — H2513 Age-related nuclear cataract, bilateral: Secondary | ICD-10-CM | POA: Diagnosis not present

## 2019-10-05 ENCOUNTER — Ambulatory Visit: Payer: Medicare Other

## 2019-10-06 ENCOUNTER — Other Ambulatory Visit: Payer: Self-pay

## 2019-10-06 ENCOUNTER — Ambulatory Visit
Admission: RE | Admit: 2019-10-06 | Discharge: 2019-10-06 | Disposition: A | Discharge status: Home / Self Care | Payer: Medicare Other | Source: Ambulatory Visit | Attending: Family Medicine | Admitting: Family Medicine

## 2019-10-06 DIAGNOSIS — Z1231 Encounter for screening mammogram for malignant neoplasm of breast: Secondary | ICD-10-CM

## 2020-02-26 DIAGNOSIS — Z23 Encounter for immunization: Secondary | ICD-10-CM | POA: Diagnosis not present

## 2020-03-16 DIAGNOSIS — E785 Hyperlipidemia, unspecified: Secondary | ICD-10-CM | POA: Diagnosis not present

## 2020-03-31 DIAGNOSIS — Z139 Encounter for screening, unspecified: Secondary | ICD-10-CM | POA: Diagnosis not present

## 2020-03-31 DIAGNOSIS — E785 Hyperlipidemia, unspecified: Secondary | ICD-10-CM | POA: Diagnosis not present

## 2020-03-31 DIAGNOSIS — I129 Hypertensive chronic kidney disease with stage 1 through stage 4 chronic kidney disease, or unspecified chronic kidney disease: Secondary | ICD-10-CM | POA: Diagnosis not present

## 2020-03-31 DIAGNOSIS — Z1331 Encounter for screening for depression: Secondary | ICD-10-CM | POA: Diagnosis not present

## 2020-03-31 DIAGNOSIS — N182 Chronic kidney disease, stage 2 (mild): Secondary | ICD-10-CM | POA: Diagnosis not present

## 2020-03-31 DIAGNOSIS — Z1339 Encounter for screening examination for other mental health and behavioral disorders: Secondary | ICD-10-CM | POA: Diagnosis not present

## 2020-03-31 DIAGNOSIS — Z0000 Encounter for general adult medical examination without abnormal findings: Secondary | ICD-10-CM | POA: Diagnosis not present

## 2020-03-31 DIAGNOSIS — H9193 Unspecified hearing loss, bilateral: Secondary | ICD-10-CM | POA: Diagnosis not present

## 2020-05-30 DIAGNOSIS — H903 Sensorineural hearing loss, bilateral: Secondary | ICD-10-CM | POA: Diagnosis not present

## 2020-08-19 DIAGNOSIS — L209 Atopic dermatitis, unspecified: Secondary | ICD-10-CM | POA: Diagnosis not present

## 2020-08-29 DIAGNOSIS — E785 Hyperlipidemia, unspecified: Secondary | ICD-10-CM | POA: Diagnosis not present

## 2020-09-05 DIAGNOSIS — N179 Acute kidney failure, unspecified: Secondary | ICD-10-CM | POA: Diagnosis not present

## 2020-09-05 DIAGNOSIS — I129 Hypertensive chronic kidney disease with stage 1 through stage 4 chronic kidney disease, or unspecified chronic kidney disease: Secondary | ICD-10-CM | POA: Diagnosis not present

## 2020-09-05 DIAGNOSIS — E785 Hyperlipidemia, unspecified: Secondary | ICD-10-CM | POA: Diagnosis not present

## 2020-09-05 DIAGNOSIS — N182 Chronic kidney disease, stage 2 (mild): Secondary | ICD-10-CM | POA: Diagnosis not present

## 2020-09-12 DIAGNOSIS — I129 Hypertensive chronic kidney disease with stage 1 through stage 4 chronic kidney disease, or unspecified chronic kidney disease: Secondary | ICD-10-CM | POA: Diagnosis not present

## 2020-09-15 DIAGNOSIS — R0982 Postnasal drip: Secondary | ICD-10-CM | POA: Diagnosis not present

## 2020-09-15 DIAGNOSIS — Z20822 Contact with and (suspected) exposure to covid-19: Secondary | ICD-10-CM | POA: Diagnosis not present

## 2020-09-15 DIAGNOSIS — Z1152 Encounter for screening for COVID-19: Secondary | ICD-10-CM | POA: Diagnosis not present

## 2020-09-15 DIAGNOSIS — J040 Acute laryngitis: Secondary | ICD-10-CM | POA: Diagnosis not present

## 2020-09-15 DIAGNOSIS — R051 Acute cough: Secondary | ICD-10-CM | POA: Diagnosis not present

## 2020-10-06 DIAGNOSIS — Z1231 Encounter for screening mammogram for malignant neoplasm of breast: Secondary | ICD-10-CM | POA: Diagnosis not present

## 2020-10-20 ENCOUNTER — Encounter: Payer: Self-pay | Admitting: Gastroenterology

## 2020-12-02 ENCOUNTER — Other Ambulatory Visit: Payer: Self-pay

## 2020-12-02 ENCOUNTER — Ambulatory Visit (AMBULATORY_SURGERY_CENTER): Payer: Medicare Other

## 2020-12-02 VITALS — Ht 64.5 in | Wt 152.0 lb

## 2020-12-02 DIAGNOSIS — Z1211 Encounter for screening for malignant neoplasm of colon: Secondary | ICD-10-CM

## 2020-12-02 NOTE — Progress Notes (Signed)
Pre visit completed via phone call; Patient verified name, DOB, and address; No egg or soy allergy known to patient  No issues with past sedation with any surgeries or procedures Patient denies ever being told they had issues or difficulty with intubation  No FH of Malignant Hyperthermia No diet pills per patient No home 02 use per patient  No blood thinners per patient  Pt denies issues with constipation  No A fib or A flutter  EMMI video to pt or via MyChart  COVID 19 guidelines implemented in PV today with Pt and RN   NO PA's for preps discussed with pt In PV today  Discussed with pt there will be an out-of-pocket cost for prep and that varies from $0 to 70 dollars   Due to the COVID-19 pandemic we are asking patients to follow certain guidelines.  Pt aware of COVID protocols and [facility] guidelines   Patient received Clenpiq sample from [facility] office;  Instructions for Clenpiq prep sent to patient;

## 2020-12-20 DIAGNOSIS — H2513 Age-related nuclear cataract, bilateral: Secondary | ICD-10-CM | POA: Diagnosis not present

## 2020-12-23 ENCOUNTER — Other Ambulatory Visit: Payer: Self-pay

## 2020-12-23 ENCOUNTER — Ambulatory Visit (AMBULATORY_SURGERY_CENTER): Payer: Medicare Other | Admitting: Gastroenterology

## 2020-12-23 ENCOUNTER — Encounter: Payer: Self-pay | Admitting: Gastroenterology

## 2020-12-23 VITALS — BP 119/73 | HR 64 | Temp 98.4°F | Resp 14 | Ht 64.5 in | Wt 152.0 lb

## 2020-12-23 DIAGNOSIS — Z1211 Encounter for screening for malignant neoplasm of colon: Secondary | ICD-10-CM | POA: Diagnosis not present

## 2020-12-23 DIAGNOSIS — K640 First degree hemorrhoids: Secondary | ICD-10-CM | POA: Diagnosis not present

## 2020-12-23 DIAGNOSIS — K5730 Diverticulosis of large intestine without perforation or abscess without bleeding: Secondary | ICD-10-CM | POA: Diagnosis not present

## 2020-12-23 MED ORDER — SODIUM CHLORIDE 0.9 % IV SOLN: 500.0000 mL | Freq: Once | INTRAVENOUS | Status: DC

## 2020-12-23 NOTE — Op Note (Signed)
[facility] Patient Name: Kim Wang Procedure Date: 12/23/2020 11:11 AM MRN: [id number] Endoscopist: Lynann Bologna , MD Age: 75 Referring MD:  Date of Birth: 1945/08/31 Gender: Female Account #: [id number] Procedure:                Colonoscopy Indications:              Screening for colorectal malignant neoplasm Medicines:                Monitored Anesthesia Care Procedure:                Pre-Anesthesia Assessment:                           - Prior to the procedure, a History and Physical                            was performed, and patient medications and                            allergies were reviewed. The patient's tolerance of                            previous anesthesia was also reviewed. The risks                            and benefits of the procedure and the sedation                            options and risks were discussed with the patient.                            All questions were answered, and informed consent                            was obtained. Prior Anticoagulants: The patient has                            taken no previous anticoagulant or antiplatelet                            agents. ASA Grade Assessment: II - A patient with                            mild systemic disease. After reviewing the risks                            and benefits, the patient was deemed in                            satisfactory condition to undergo the procedure.                           After obtaining informed consent, the colonoscope  was passed under direct vision. Throughout the                            procedure, the patient's blood pressure, pulse, and                            oxygen saturations were monitored continuously. The                            Olympus PCF-H190DL [id number]) Colonoscope was                            introduced through the anus and advanced to the the                            cecum, identified by  appendiceal orifice and                            ileocecal valve. 2 cm of TI was intubated                            thereafter. The colonoscopy was performed without                            difficulty. The patient tolerated the procedure                            well. The quality of the bowel preparation was                            good. The ileocecal valve, appendiceal orifice, and                            rectum were photographed. Scope In: 11:20:54 AM Scope Out: 11:41:14 AM Scope Withdrawal Time: 0 hours 12 minutes 55 seconds  Total Procedure Duration: 0 hours 20 minutes 20 seconds  Findings:                 Multiple medium-mouthed diverticula were found in                            the sigmoid colon, descending colon and ascending                            colon. Strawlike vegetable material was seen in the                            diverticula. This was taken out by cold biopsy                            forceps.                           Non-bleeding internal hemorrhoids were found during  retroflexion. The hemorrhoids were small and Grade                            I (internal hemorrhoids that do not prolapse).                           The exam was otherwise without abnormality on                            direct and retroflexion views. Complications:            No immediate complications. Estimated Blood Loss:     Estimated blood loss: none. Impression:               - Moderate pancolonic diverticulosis.                           - Non-bleeding internal hemorrhoids.                           - The examination was otherwise normal on direct                            and retroflexion views.                           - No specimens collected. Recommendation:           - Patient has a contact number available for                            emergencies. The signs and symptoms of potential                            delayed complications  were discussed with the                            patient. Return to normal activities tomorrow.                            Written discharge instructions were provided to the                            patient.                           - High fiber diet.                           - Continue present medications.                           - Repeat colonoscopy is not recommended for                            screening purposes.                           -  The findings and recommendations were discussed                            with the patient's family. Lynann Bologna, MD 12/23/2020 11:47:41 AM This report has been signed electronically.

## 2020-12-23 NOTE — Progress Notes (Signed)
[facility] History and Physical   Primary Care Physician:  Abner Greenspan, MD   Reason for Procedure:   Colorectal cancer screening  Plan:     colonoscopy     HPI: Kim Wang is a 75 y.o. female  No nausea, vomiting, heartburn, regurgitation, odynophagia or dysphagia.  No significant diarrhea or constipation.  No melena or hematochezia. No unintentional weight loss. No abdominal pain.   Past Medical History:  Diagnosis Date   Hyperlipidemia    on meds   Hypertension    on meds   Osteoporosis    bilateral hips    Past Surgical History:  Procedure Laterality Date   COLONOSCOPY  2012   RG-normal/tics- 10 yr recall   TUMOR REMOVAL Left    LEFT leg lymphoid tumor removed    Prior to Admission medications   Medication Sig Start Date End Date Taking? Authorizing Provider  atorvastatin (LIPITOR) 40 MG tablet Take 40 mg by mouth daily. 11/21/20  Yes [provider]  Calcium Carbonate-Vitamin D 600-200 MG-UNIT TABS Take 2 tablets by mouth daily at 6 (six) AM.   Yes [provider]  diphenhydrAMINE (SOMINEX) 25 MG tablet Take 25 mg by mouth at bedtime as needed for sleep.   Yes [provider]  ezetimibe (ZETIA) 10 MG tablet Take 10 mg by mouth daily. 11/22/20  Yes [provider]  lisinopril (ZESTRIL) 10 MG tablet Take 10 mg by mouth daily. 11/21/20  Yes [provider]  Omega 3 1200 MG CAPS Take 1 capsule by mouth daily at 6 (six) AM.   Yes [provider]  triamcinolone (KENALOG) 0.025 % cream Apply 1 application topically 2 (two) times daily. 08/19/20  Yes [provider]  valACYclovir (VALTREX) 1000 MG tablet Take 1,000 mg by mouth 3 (three) times daily as needed. 09/05/20  Yes [provider]  celecoxib (CELEBREX) 200 MG capsule Take 200 mg by mouth daily as needed. Patient not taking: Reported on 12/23/2020 07/05/20   [provider]  guaiFENesin (MUCINEX PO) Take 1 tablet by mouth  daily as needed. Patient not taking: Reported on 12/23/2020    [provider]    Current Outpatient Medications  Medication Sig Dispense Refill   atorvastatin (LIPITOR) 40 MG tablet Take 40 mg by mouth daily.     Calcium Carbonate-Vitamin D 600-200 MG-UNIT TABS Take 2 tablets by mouth daily at 6 (six) AM.     diphenhydrAMINE (SOMINEX) 25 MG tablet Take 25 mg by mouth at bedtime as needed for sleep.     ezetimibe (ZETIA) 10 MG tablet Take 10 mg by mouth daily.     lisinopril (ZESTRIL) 10 MG tablet Take 10 mg by mouth daily.     Omega 3 1200 MG CAPS Take 1 capsule by mouth daily at 6 (six) AM.     triamcinolone (KENALOG) 0.025 % cream Apply 1 application topically 2 (two) times daily.     valACYclovir (VALTREX) 1000 MG tablet Take 1,000 mg by mouth 3 (three) times daily as needed.     celecoxib (CELEBREX) 200 MG capsule Take 200 mg by mouth daily as needed. (Patient not taking: Reported on 12/23/2020)     guaiFENesin (MUCINEX PO) Take 1 tablet by mouth daily as needed. (Patient not taking: Reported on 12/23/2020)     Current Facility-Administered Medications  Medication Dose Route Frequency Provider Last Rate Last Admin   0.9 %  sodium chloride infusion  500 mL Intravenous Once Lynann Bologna, MD  Allergies as of 12/23/2020   (No Known Allergies)    Family History  Problem Relation Age of Onset   Breast cancer Mother    Bronchitis Father    Chronic bronchitis Father    Colon polyps Neg Hx    Colon cancer Neg Hx    Esophageal cancer Neg Hx    Prostate cancer Neg Hx    Rectal cancer Neg Hx    Stomach cancer Neg Hx     Social History   Socioeconomic History   Marital status: Married    Spouse name: Not on file   Number of children: Not on file   Years of education: Not on file   Highest education level: Not on file  Occupational History   Not on file  Tobacco Use   Smoking status: Never   Smokeless tobacco: Never  Vaping Use   Vaping Use: Never used   Substance and Sexual Activity   Alcohol use: Yes    Alcohol/week: 7.0 standard drinks    Types: 7 Standard drinks or equivalent per week   Drug use: Never   Sexual activity: Not on file  Other Topics Concern   Not on file  Social History Narrative   Not on file   Social Determinants of Health   Financial Resource Strain: Not on file  Food Insecurity: Not on file  Transportation Needs: Not on file  Physical Activity: Not on file  Stress: Not on file  Social Connections: Not on file  Intimate Partner Violence: Not on file    Review of Systems: All other review of systems negative except as mentioned in the HPI.  Physical Exam: Vital signs in last 24 hours: @VSRANGES @   General:   Alert,  Well-developed, well-nourished, pleasant and cooperative in NAD Lungs:  Clear throughout to auscultation.   Heart:  Regular rate and rhythm; no murmurs, clicks, rubs,  or gallops. Abdomen:  Soft, nontender and nondistended. Normal bowel sounds.   Neuro/Psych:  Alert and cooperative. Normal mood and affect. A and O x 3    No significant changes were identified.  The patient continues to be an appropriate candidate for the planned procedure and anesthesia.   , MD. [facility] 12/23/2020 11:12 AM@

## 2020-12-23 NOTE — Patient Instructions (Signed)
Discharge instructions given. Handouts on polyps,diverticulosis and hemorrhoids. Resume previous medications. YOU HAD AN ENDOSCOPIC PROCEDURE TODAY AT THE [facility]:   Refer to the procedure report that was given to you for any specific questions about what was found during the examination.  If the procedure report does not answer your questions, please call your gastroenterologist to clarify.  If you requested that your care partner not be given the details of your procedure findings, then the procedure report has been included in a sealed envelope for you to review at your convenience later.  YOU SHOULD EXPECT: Some feelings of bloating in the abdomen. Passage of more gas than usual.  Walking can help get rid of the air that was put into your GI tract during the procedure and reduce the bloating. If you had a lower endoscopy (such as a colonoscopy or flexible sigmoidoscopy) you may notice spotting of blood in your stool or on the toilet paper. If you underwent a bowel prep for your procedure, you may not have a normal bowel movement for a few days.  Please Note:  You might notice some irritation and congestion in your nose or some drainage.  This is from the oxygen used during your procedure.  There is no need for concern and it should clear up in a day or so.  SYMPTOMS TO REPORT IMMEDIATELY:   Following lower endoscopy (colonoscopy or flexible sigmoidoscopy):  Excessive amounts of blood in the stool  Significant tenderness or worsening of abdominal pains  Swelling of the abdomen that is new, acute  Fever of 100F or higher   For urgent or emergent issues, a gastroenterologist can be reached at any hour by calling [phone]. Do not use MyChart messaging for urgent concerns.    DIET:  We do recommend a small meal at first, but then you may proceed to your regular diet.  Drink plenty of fluids but you should avoid alcoholic beverages for 24 hours.  ACTIVITY:  You should  plan to take it easy for the rest of today and you should NOT DRIVE or use heavy machinery until tomorrow (because of the sedation medicines used during the test).    FOLLOW UP: Our staff will call the number listed on your records 48-72 hours following your procedure to check on you and address any questions or concerns that you may have regarding the information given to you following your procedure. If we do not reach you, we will leave a message.  We will attempt to reach you two times.  During this call, we will ask if you have developed any symptoms of COVID 19. If you develop any symptoms (ie: fever, flu-like symptoms, shortness of breath, cough etc.) before then, please call [phone].  If you test positive for Covid 19 in the 2 weeks post procedure, please call and report this information to us.    If any biopsies were taken you will be contacted by phone or by letter within the next 1-3 weeks.  Please call us at [phone] if you have not heard about the biopsies in 3 weeks.    SIGNATURES/CONFIDENTIALITY: You and/or your care partner have signed paperwork which will be entered into your electronic medical record.  These signatures attest to the fact that that the information above on your After Visit Summary has been reviewed and is understood.  Full responsibility of the confidentiality of this discharge information lies with you and/or your care-partner. 

## 2020-12-23 NOTE — Progress Notes (Signed)
Pt's states no medical or surgical changes since previsit or office visit. VS assessed by C.W 

## 2020-12-23 NOTE — Progress Notes (Signed)
To PACU, VSS. Report to [email or web address] 

## 2020-12-27 ENCOUNTER — Telehealth: Payer: Self-pay | Admitting: *Deleted

## 2020-12-27 NOTE — Telephone Encounter (Signed)
Message left

## 2020-12-27 NOTE — Telephone Encounter (Signed)
  Follow up Call-  Call back number 12/23/2020  Post procedure Call Back phone  # [phone]  Permission to leave phone message Yes  Some recent data might be hidden     Patient questions:  Do you have a fever, pain , or abdominal swelling? No. Pain Score  0 *  Have you tolerated food without any problems? Yes.    Have you been able to return to your normal activities? Yes.    Do you have any questions about your discharge instructions: Diet   No. Medications  No. Follow up visit  No.  Do you have questions or concerns about your Care? No.  Actions: * If pain score is 4 or above: No action needed, pain <4.  Have you developed a fever since your procedure? no  2.   Have you had an respiratory symptoms (SOB or cough) since your procedure? no  3.   Have you tested positive for COVID 19 since your procedure no  4.   Have you had any family members/close contacts diagnosed with the COVID 19 since your procedure?  no   If yes to any of these questions please route to Laverna Peace, RN and Karlton Lemon, RN

## 2021-01-10 DIAGNOSIS — H2512 Age-related nuclear cataract, left eye: Secondary | ICD-10-CM | POA: Diagnosis not present

## 2021-01-10 DIAGNOSIS — Z01818 Encounter for other preprocedural examination: Secondary | ICD-10-CM | POA: Diagnosis not present

## 2021-01-10 DIAGNOSIS — H25813 Combined forms of age-related cataract, bilateral: Secondary | ICD-10-CM | POA: Diagnosis not present

## 2021-01-10 DIAGNOSIS — H25812 Combined forms of age-related cataract, left eye: Secondary | ICD-10-CM | POA: Diagnosis not present

## 2021-01-13 DIAGNOSIS — H2513 Age-related nuclear cataract, bilateral: Secondary | ICD-10-CM | POA: Diagnosis not present

## 2021-01-17 DIAGNOSIS — H2513 Age-related nuclear cataract, bilateral: Secondary | ICD-10-CM | POA: Diagnosis not present

## 2021-01-19 DIAGNOSIS — H2513 Age-related nuclear cataract, bilateral: Secondary | ICD-10-CM | POA: Diagnosis not present

## 2021-01-31 DIAGNOSIS — H2511 Age-related nuclear cataract, right eye: Secondary | ICD-10-CM | POA: Diagnosis not present

## 2021-01-31 DIAGNOSIS — H25811 Combined forms of age-related cataract, right eye: Secondary | ICD-10-CM | POA: Diagnosis not present

## 2021-02-08 DIAGNOSIS — H2513 Age-related nuclear cataract, bilateral: Secondary | ICD-10-CM | POA: Diagnosis not present

## 2021-02-09 DIAGNOSIS — E785 Hyperlipidemia, unspecified: Secondary | ICD-10-CM | POA: Diagnosis not present

## 2021-02-14 DIAGNOSIS — L245 Irritant contact dermatitis due to other chemical products: Secondary | ICD-10-CM | POA: Diagnosis not present

## 2021-02-14 DIAGNOSIS — L299 Pruritus, unspecified: Secondary | ICD-10-CM | POA: Diagnosis not present

## 2021-02-17 DIAGNOSIS — I129 Hypertensive chronic kidney disease with stage 1 through stage 4 chronic kidney disease, or unspecified chronic kidney disease: Secondary | ICD-10-CM | POA: Diagnosis not present

## 2021-02-17 DIAGNOSIS — Z23 Encounter for immunization: Secondary | ICD-10-CM | POA: Diagnosis not present

## 2021-02-17 DIAGNOSIS — N182 Chronic kidney disease, stage 2 (mild): Secondary | ICD-10-CM | POA: Diagnosis not present

## 2021-02-17 DIAGNOSIS — E785 Hyperlipidemia, unspecified: Secondary | ICD-10-CM | POA: Diagnosis not present

## 2021-02-22 DIAGNOSIS — H2513 Age-related nuclear cataract, bilateral: Secondary | ICD-10-CM | POA: Diagnosis not present

## 2021-03-01 DIAGNOSIS — L245 Irritant contact dermatitis due to other chemical products: Secondary | ICD-10-CM | POA: Diagnosis not present

## 2021-03-07 DIAGNOSIS — Z23 Encounter for immunization: Secondary | ICD-10-CM | POA: Diagnosis not present

## 2021-04-04 DIAGNOSIS — L719 Rosacea, unspecified: Secondary | ICD-10-CM | POA: Diagnosis not present

## 2021-04-06 DIAGNOSIS — Z1331 Encounter for screening for depression: Secondary | ICD-10-CM | POA: Diagnosis not present

## 2021-04-06 DIAGNOSIS — N182 Chronic kidney disease, stage 2 (mild): Secondary | ICD-10-CM | POA: Diagnosis not present

## 2021-04-06 DIAGNOSIS — Z1339 Encounter for screening examination for other mental health and behavioral disorders: Secondary | ICD-10-CM | POA: Diagnosis not present

## 2021-04-06 DIAGNOSIS — Z136 Encounter for screening for cardiovascular disorders: Secondary | ICD-10-CM | POA: Diagnosis not present

## 2021-04-06 DIAGNOSIS — Z139 Encounter for screening, unspecified: Secondary | ICD-10-CM | POA: Diagnosis not present

## 2021-04-06 DIAGNOSIS — Z0000 Encounter for general adult medical examination without abnormal findings: Secondary | ICD-10-CM | POA: Diagnosis not present

## 2021-04-06 DIAGNOSIS — I129 Hypertensive chronic kidney disease with stage 1 through stage 4 chronic kidney disease, or unspecified chronic kidney disease: Secondary | ICD-10-CM | POA: Diagnosis not present

## 2021-09-12 DIAGNOSIS — E785 Hyperlipidemia, unspecified: Secondary | ICD-10-CM | POA: Diagnosis not present

## 2021-09-12 DIAGNOSIS — I129 Hypertensive chronic kidney disease with stage 1 through stage 4 chronic kidney disease, or unspecified chronic kidney disease: Secondary | ICD-10-CM | POA: Diagnosis not present

## 2021-09-19 DIAGNOSIS — I129 Hypertensive chronic kidney disease with stage 1 through stage 4 chronic kidney disease, or unspecified chronic kidney disease: Secondary | ICD-10-CM | POA: Diagnosis not present

## 2021-09-19 DIAGNOSIS — E785 Hyperlipidemia, unspecified: Secondary | ICD-10-CM | POA: Diagnosis not present

## 2021-09-19 DIAGNOSIS — N182 Chronic kidney disease, stage 2 (mild): Secondary | ICD-10-CM | POA: Diagnosis not present

## 2021-09-19 DIAGNOSIS — Z23 Encounter for immunization: Secondary | ICD-10-CM | POA: Diagnosis not present

## 2021-09-20 ENCOUNTER — Other Ambulatory Visit: Payer: Self-pay | Admitting: Family Medicine

## 2021-09-20 DIAGNOSIS — Z1231 Encounter for screening mammogram for malignant neoplasm of breast: Secondary | ICD-10-CM

## 2021-09-30 ENCOUNTER — Encounter: Payer: Self-pay | Admitting: Cardiology

## 2021-09-30 DIAGNOSIS — J9811 Atelectasis: Secondary | ICD-10-CM | POA: Diagnosis not present

## 2021-09-30 DIAGNOSIS — R000 Tachycardia, unspecified: Secondary | ICD-10-CM | POA: Diagnosis not present

## 2021-09-30 DIAGNOSIS — Z7901 Long term (current) use of anticoagulants: Secondary | ICD-10-CM | POA: Diagnosis not present

## 2021-09-30 DIAGNOSIS — R002 Palpitations: Secondary | ICD-10-CM | POA: Diagnosis not present

## 2021-09-30 DIAGNOSIS — Z79899 Other long term (current) drug therapy: Secondary | ICD-10-CM | POA: Diagnosis not present

## 2021-09-30 DIAGNOSIS — I4891 Unspecified atrial fibrillation: Secondary | ICD-10-CM | POA: Diagnosis not present

## 2021-09-30 DIAGNOSIS — R42 Dizziness and giddiness: Secondary | ICD-10-CM | POA: Diagnosis not present

## 2021-09-30 DIAGNOSIS — E785 Hyperlipidemia, unspecified: Secondary | ICD-10-CM | POA: Diagnosis not present

## 2021-09-30 DIAGNOSIS — E7800 Pure hypercholesterolemia, unspecified: Secondary | ICD-10-CM | POA: Diagnosis not present

## 2021-09-30 DIAGNOSIS — Z7982 Long term (current) use of aspirin: Secondary | ICD-10-CM | POA: Diagnosis not present

## 2021-09-30 DIAGNOSIS — R7989 Other specified abnormal findings of blood chemistry: Secondary | ICD-10-CM | POA: Diagnosis not present

## 2021-09-30 DIAGNOSIS — I480 Paroxysmal atrial fibrillation: Secondary | ICD-10-CM | POA: Diagnosis not present

## 2021-09-30 DIAGNOSIS — I10 Essential (primary) hypertension: Secondary | ICD-10-CM | POA: Diagnosis not present

## 2021-09-30 DIAGNOSIS — I340 Nonrheumatic mitral (valve) insufficiency: Secondary | ICD-10-CM | POA: Diagnosis not present

## 2021-09-30 DIAGNOSIS — R519 Headache, unspecified: Secondary | ICD-10-CM | POA: Diagnosis not present

## 2021-09-30 DIAGNOSIS — I361 Nonrheumatic tricuspid (valve) insufficiency: Secondary | ICD-10-CM | POA: Diagnosis not present

## 2021-10-01 DIAGNOSIS — I480 Paroxysmal atrial fibrillation: Secondary | ICD-10-CM | POA: Diagnosis not present

## 2021-10-01 DIAGNOSIS — I10 Essential (primary) hypertension: Secondary | ICD-10-CM | POA: Diagnosis not present

## 2021-10-01 DIAGNOSIS — E785 Hyperlipidemia, unspecified: Secondary | ICD-10-CM | POA: Diagnosis not present

## 2021-10-02 DIAGNOSIS — E785 Hyperlipidemia, unspecified: Secondary | ICD-10-CM | POA: Diagnosis not present

## 2021-10-02 DIAGNOSIS — I480 Paroxysmal atrial fibrillation: Secondary | ICD-10-CM | POA: Diagnosis not present

## 2021-10-02 DIAGNOSIS — I10 Essential (primary) hypertension: Secondary | ICD-10-CM | POA: Diagnosis not present

## 2021-10-05 DIAGNOSIS — I129 Hypertensive chronic kidney disease with stage 1 through stage 4 chronic kidney disease, or unspecified chronic kidney disease: Secondary | ICD-10-CM | POA: Diagnosis not present

## 2021-10-05 DIAGNOSIS — N182 Chronic kidney disease, stage 2 (mild): Secondary | ICD-10-CM | POA: Diagnosis not present

## 2021-10-05 DIAGNOSIS — I4891 Unspecified atrial fibrillation: Secondary | ICD-10-CM | POA: Diagnosis not present

## 2021-10-05 DIAGNOSIS — Z6826 Body mass index (BMI) 26.0-26.9, adult: Secondary | ICD-10-CM | POA: Diagnosis not present

## 2021-10-11 ENCOUNTER — Other Ambulatory Visit: Payer: Self-pay

## 2021-10-11 DIAGNOSIS — M810 Age-related osteoporosis without current pathological fracture: Secondary | ICD-10-CM | POA: Insufficient documentation

## 2021-10-11 DIAGNOSIS — I10 Essential (primary) hypertension: Secondary | ICD-10-CM | POA: Insufficient documentation

## 2021-10-11 DIAGNOSIS — E785 Hyperlipidemia, unspecified: Secondary | ICD-10-CM | POA: Insufficient documentation

## 2021-10-13 ENCOUNTER — Ambulatory Visit (INDEPENDENT_AMBULATORY_CARE_PROVIDER_SITE_OTHER): Payer: Medicare Other | Admitting: Cardiology

## 2021-10-13 ENCOUNTER — Encounter: Payer: Self-pay | Admitting: Cardiology

## 2021-10-13 VITALS — BP 134/82 | HR 74 | Ht 64.0 in | Wt 155.8 lb

## 2021-10-13 DIAGNOSIS — I10 Essential (primary) hypertension: Secondary | ICD-10-CM

## 2021-10-13 DIAGNOSIS — R0609 Other forms of dyspnea: Secondary | ICD-10-CM | POA: Insufficient documentation

## 2021-10-13 DIAGNOSIS — I480 Paroxysmal atrial fibrillation: Secondary | ICD-10-CM | POA: Diagnosis not present

## 2021-10-13 HISTORY — DX: Paroxysmal atrial fibrillation: I48.0

## 2021-10-13 HISTORY — DX: Other forms of dyspnea: R06.09

## 2021-10-13 MED ORDER — FLECAINIDE ACETATE 100 MG PO TABS: 100.0000 mg | ORAL_TABLET | Freq: Two times a day (BID) | ORAL | 3 refills | Status: DC

## 2021-10-13 MED ORDER — APIXABAN 5 MG PO TABS: 5.0000 mg | ORAL_TABLET | Freq: Two times a day (BID) | ORAL | 3 refills | Status: DC

## 2021-10-13 MED ORDER — ATORVASTATIN CALCIUM 40 MG PO TABS: 40.0000 mg | ORAL_TABLET | Freq: Every day | ORAL | 3 refills | Status: DC

## 2021-10-13 MED ORDER — DILTIAZEM HCL ER 180 MG PO CP24: 180.0000 mg | ORAL_CAPSULE | Freq: Every day | ORAL | 3 refills | Status: DC

## 2021-10-13 MED ORDER — LISINOPRIL 10 MG PO TABS: 40.0000 mg | ORAL_TABLET | Freq: Every day | ORAL | 3 refills | Status: DC

## 2021-10-13 NOTE — Addendum Note (Signed)
Addended by: Eleonore Chiquito on: 10/13/2021 05:01 PM   Modules accepted: Orders

## 2021-10-13 NOTE — Patient Instructions (Signed)
Medication Instructions:  Your physician has recommended you make the following change in your medication:    Increase your Flecainide to 100 mg twice daily.  *If you need a refill on your cardiac medications before your next appointment, please call your pharmacy*   Lab Work: Your physician recommends that you have a BMET and Magnesium today in the office.  If you have labs (blood work) drawn today and your tests are completely normal, you will receive your results only by: MyChart Message (if you have MyChart) OR A paper copy in the mail If you have any lab test that is abnormal or we need to change your treatment, we will call you to review the results.   Testing/Procedures: Your physician has requested that you have a lexiscan myoview. For further information please visit [email or web address] Please follow instruction sheet, as given.  The test will take approximately 3 to 4 hours to complete; you may bring reading material.  If someone comes with you to your appointment, they will need to remain in the main lobby due to limited space in the testing area.   How to prepare for your Myocardial Perfusion Test: Do not eat or drink 3 hours prior to your test, except you may have water. Do not consume products containing caffeine (regular or decaffeinated) 12 hours prior to your test. (ex: coffee, chocolate, sodas, tea). Do bring a list of your current medications with you.  If not listed below, you may take your medications as normal. Do wear comfortable clothes (no dresses or overalls) and walking shoes, tennis shoes preferred (No heels or open toe shoes are allowed). Do NOT wear cologne, perfume, aftershave, or lotions (deodorant is allowed). If these instructions are not followed, your test will have to be rescheduled.    Follow-Up: At [facility], you and your health needs are our priority.  As part of our continuing mission to provide you with exceptional heart care, we have  created designated Provider Care Teams.  These Care Teams include your primary Cardiologist (physician) and Advanced Practice Providers (APPs -  Physician Assistants and Nurse Practitioners) who all work together to provide you with the care you need, when you need it.  We recommend signing up for the patient portal called "MyChart".  Sign up information is provided on this After Visit Summary.  MyChart is used to connect with patients for Virtual Visits (Telemedicine).  Patients are able to view lab/test results, encounter notes, upcoming appointments, etc.  Non-urgent messages can be sent to your provider as well.   To learn more about what you can do with MyChart, go to [email or web address].    Your next appointment:   4 month(s)  The format for your next appointment:   In Person  Provider:   Belva Crome, MD   Other Instructions Cardiac Nuclear Scan A cardiac nuclear scan is a test that is done to check the flow of blood to your heart. It is done when you are resting and when you are exercising. The test looks for problems such as: Not enough blood reaching a portion of the heart. The heart muscle not working as it should. You may need this test if: You have heart disease. You have had lab results that are not normal. You have had heart surgery or a balloon procedure to open up blocked arteries (angioplasty). You have chest pain. You have shortness of breath. In this test, a special dye (tracer) is put into your bloodstream. The tracer will travel to  your heart. A camera will then take pictures of your heart to see how the tracer moves through your heart. This test is usually done at a hospital and takes 2-4 hours. Tell a doctor about: Any allergies you have. All medicines you are taking, including vitamins, herbs, eye drops, creams, and over-the-counter medicines. Any problems you or family members have had with anesthetic medicines. Any blood disorders you have. Any  surgeries you have had. Any medical conditions you have. Whether you are pregnant or may be pregnant. What are the risks? Generally, this is a safe test. However, problems may occur, such as: Serious chest pain and heart attack. This is only a risk if the stress portion of the test is done. Rapid heartbeat. A feeling of warmth in your chest. This feeling usually does not last long. Allergic reaction to the tracer. What happens before the test? Ask your doctor about changing or stopping your normal medicines. This is important. Follow instructions from your doctor about what you cannot eat or drink. Remove your jewelry on the day of the test. What happens during the test? An IV tube will be inserted into one of your veins. Your doctor will give you a small amount of tracer through the IV tube. You will wait for 20-40 minutes while the tracer moves through your bloodstream. Your heart will be monitored with an electrocardiogram (ECG). You will lie down on an exam table. Pictures of your heart will be taken for about 15-20 minutes. You may also have a stress test. For this test, one of these things may be done: You will be asked to exercise on a treadmill or a stationary bike. You will be given medicines that will make your heart work harder. This is done if you are unable to exercise. When blood flow to your heart has peaked, a tracer will again be given through the IV tube. After 20-40 minutes, you will get back on the exam table. More pictures will be taken of your heart. Depending on the tracer that is used, more pictures may need to be taken 3-4 hours later. Your IV tube will be removed when the test is over. The test may vary among doctors and hospitals. What happens after the test? Ask your doctor: Whether you can return to your normal schedule, including diet, activities, and medicines. Whether you should drink more fluids. This will help to remove the tracer from your body. Drink  enough fluid to keep your pee (urine) pale yellow. Ask your doctor, or the department that is doing the test: When will my results be ready? How will I get my results? Summary A cardiac nuclear scan is a test that is done to check the flow of blood to your heart. Tell your doctor whether you are pregnant or may be pregnant. Before the test, ask your doctor about changing or stopping your normal medicines. This is important. Ask your doctor whether you can return to your normal activities. You may be asked to drink more fluids. This information is not intended to replace advice given to you by your health care provider. Make sure you discuss any questions you have with your health care provider. Document Revised: 08/06/2018 Document Reviewed: 09/30/2017 Elsevier Patient Education  2021 [employer].

## 2021-10-13 NOTE — Progress Notes (Signed)
Cardiology Office Note:    Date:  10/13/2021   ID:  [name], [place] [birth year], MRN [id number]  PCP:  Abner Greenspan, MD  Cardiologist:  Garwin Brothers, MD   Referring MD: Abner Greenspan, MD    ASSESSMENT:    1. Primary hypertension   2. Paroxysmal atrial fibrillation (HCC)   3. Dyspnea on exertion    PLAN:    In order of problems listed above:  Primary prevention stressed with the patient.  Importance of compliance with diet and medication stressed and she vocalized understanding.  She was advised to walk at least half an hour a day 5 days a week and she promises to do so. Dyspnea on exertion: Steady in nature we will do an exercise stress Cardiolite to assess this.  This is also because she is on medications like flecainide. Paroxysmal atrial fibrillation:I discussed with the patient atrial fibrillation, disease process. Management and therapy including rate and rhythm control, anticoagulation benefits and potential risks were discussed extensively with the patient. Patient had multiple questions which were answered to patient's satisfaction.  I told her to stay away from caffeinated beverages and excessive sweets.  She promises to do so.  I will double flecainide to 100 mg twice daily.  She will have an EKG done when she comes for a stress test. Essential hypertension: Blood pressure stable and lifestyle modification urged diet was emphasized and she promises to do well. Patient will be seen in follow-up appointment in 6 months or earlier if the patient has any concerns.  I reviewed records from [facility] extensively and questions were answered to her satisfaction.    Medication Adjustments/Labs and Tests Ordered: Current medicines are reviewed at length with the patient today.  Concerns regarding medicines are outlined above.  No orders of the defined types were placed in this encounter.  No orders of the defined types were placed in this encounter.    Chief  Complaint  Patient presents with   Hypertension     History of Present Illness:    Kim Wang is a 76 y.o. female she was evaluated at [facility] hospital for elevated blood pressure and paroxysmal atrial fibrillation by Dr. Dulce Sellar.  She is currently on anticoagulation.  She denies any problems at this time and takes care of activities of daily living.  She has some shortness of breath on exertion.  No chest pain orthopnea or PND.  At the time of my evaluation, the patient is alert awake oriented and in no distress.  She mentions to me that she has 2 episodes of atrial fibrillation while at home since hospital discharge.  Both the time she took a little extra coffee and some sweets.  At the time of my evaluation, the patient is alert awake oriented and in no distress.  Past Medical History:  Diagnosis Date   Hyperlipidemia    on meds   Hypertension    on meds   Osteoporosis    bilateral hips    Past Surgical History:  Procedure Laterality Date   COLONOSCOPY  2012   RG-normal/tics- 10 yr recall   TUMOR REMOVAL Left    LEFT leg lymphoid tumor removed    Current Medications: Current Meds  Medication Sig   apixaban (ELIQUIS) 5 MG TABS tablet Take 5 mg by mouth 2 (two) times daily.   atorvastatin (LIPITOR) 40 MG tablet Take 40 mg by mouth daily.   Calcium Carbonate-Vitamin D 600-200 MG-UNIT TABS Take 2 tablets by mouth  daily at 6 (six) AM.   diltiazem (DILACOR XR) 180 MG 24 hr capsule Take 180 mg by mouth daily.   diphenhydrAMINE (SOMINEX) 25 MG tablet Take 25 mg by mouth at bedtime as needed for sleep.   ezetimibe (ZETIA) 10 MG tablet Take 10 mg by mouth daily.   flecainide (TAMBOCOR) 100 MG tablet Take 50 mg by mouth 2 (two) times daily.   guaiFENesin (MUCINEX PO) Take 1 tablet by mouth daily as needed (cough AND MUCUS).   lisinopril (ZESTRIL) 10 MG tablet Take 40 mg by mouth daily.   Omega 3 1200 MG CAPS Take 1 capsule by mouth daily at 6 (six) AM.   valACYclovir (VALTREX)  1000 MG tablet Take 1,000 mg by mouth 3 (three) times daily as needed for other. Cold sores     Allergies:   Patient has no known allergies.   Social History   Socioeconomic History   Marital status: Married    Spouse name: Not on file   Number of children: Not on file   Years of education: Not on file   Highest education level: Not on file  Occupational History   Not on file  Tobacco Use   Smoking status: Never   Smokeless tobacco: Never  Vaping Use   Vaping Use: Never used  Substance and Sexual Activity   Alcohol use: Yes    Alcohol/week: 7.0 standard drinks of alcohol    Types: 7 Standard drinks or equivalent per week   Drug use: Never   Sexual activity: Not on file  Other Topics Concern   Not on file  Social History Narrative   Not on file   Social Determinants of Health   Financial Resource Strain: Not on file  Food Insecurity: Not on file  Transportation Needs: Not on file  Physical Activity: Not on file  Stress: Not on file  Social Connections: Not on file     Family History: The patient's family history includes Breast cancer in her mother; Bronchitis in her father; Chronic bronchitis in her father. There is no history of Colon polyps, Colon cancer, Esophageal cancer, Prostate cancer, Rectal cancer, or Stomach cancer.  ROS:   Please see the history of present illness.    All other systems reviewed and are negative.  EKGs/Labs/Other Studies Reviewed:    The following studies were reviewed today: EKG reveals sinus rhythm prolonged QT and nonspecific ST-T changes   Recent Labs: No results found for requested labs within last 365 days.  Recent Lipid Panel No results found for: "CHOL", "TRIG", "HDL", "CHOLHDL", "VLDL", "LDLCALC", "LDLDIRECT"  Physical Exam:    VS:  BP 134/82 (BP Location: Left Arm, Patient Position: Sitting)   Pulse 74   Ht 5\' 4"  (1.626 m)   Wt 155 lb 12.8 oz (70.7 kg)   SpO2 97%   BMI 26.74 kg/m     Wt Readings from Last 3  Encounters:  10/13/21 155 lb 12.8 oz (70.7 kg)  12/23/20 152 lb (68.9 kg)  12/02/20 152 lb (68.9 kg)     GEN: Patient is in no acute distress HEENT: Normal NECK: No JVD; No carotid bruits LYMPHATICS: No lymphadenopathy CARDIAC: Hear sounds regular, 2/6 systolic murmur at the apex. RESPIRATORY:  Clear to auscultation without rales, wheezing or rhonchi  ABDOMEN: Soft, non-tender, non-distended MUSCULOSKELETAL:  No edema; No deformity  SKIN: Warm and dry NEUROLOGIC:  Alert and oriented x 3 PSYCHIATRIC:  Normal affect   Signed, 02/01/21, MD  10/13/2021 3:21 PM  [facility]

## 2021-10-14 LAB — BASIC METABOLIC PANEL
BUN/Creatinine Ratio: 18 (ref 12–28)
BUN: 16 mg/dL (ref 8–27)
CO2: 19 mmol/L — ABNORMAL LOW (ref 20–29)
Calcium: 10 mg/dL (ref 8.7–10.3)
Chloride: 100 mmol/L (ref 96–106)
Creatinine, Ser: 0.87 mg/dL (ref 0.57–1.00)
Glucose: 98 mg/dL (ref 70–99)
Potassium: 4.2 mmol/L (ref 3.5–5.2)
Sodium: 136 mmol/L (ref 134–144)
eGFR: 69 mL/min/{1.73_m2} (ref >59)

## 2021-10-14 LAB — MAGNESIUM: Magnesium: 2.1 mg/dL (ref 1.6–2.3)

## 2021-10-16 ENCOUNTER — Ambulatory Visit
Admission: RE | Admit: 2021-10-16 | Discharge: 2021-10-16 | Disposition: A | Discharge status: Home / Self Care | Payer: Medicare Other | Source: Ambulatory Visit | Attending: Family Medicine | Admitting: Family Medicine

## 2021-10-16 DIAGNOSIS — Z1231 Encounter for screening mammogram for malignant neoplasm of breast: Secondary | ICD-10-CM

## 2021-10-16 DIAGNOSIS — I4891 Unspecified atrial fibrillation: Secondary | ICD-10-CM | POA: Diagnosis not present

## 2021-10-16 DIAGNOSIS — Z6826 Body mass index (BMI) 26.0-26.9, adult: Secondary | ICD-10-CM | POA: Diagnosis not present

## 2021-10-17 ENCOUNTER — Telehealth: Payer: Self-pay

## 2021-10-17 NOTE — Telephone Encounter (Signed)
Patient notified of results, results sent to PCP ?

## 2021-10-17 NOTE — Telephone Encounter (Signed)
-----   Message from Garwin Brothers, MD sent at 10/14/2021  6:35 PM EDT ----- The results of the study is unremarkable. Please inform patient. I will discuss in detail at next appointment. Cc  primary care/referring physician Garwin Brothers, MD 10/14/2021 6:35 PM

## 2021-10-18 ENCOUNTER — Telehealth: Payer: Self-pay

## 2021-10-18 NOTE — Telephone Encounter (Signed)
Spoke with the patient, detailed instructions given. She stated that she would be here for her test. Asked to call back with any questions. S.Pleasant Bensinger EMTP 

## 2021-10-24 ENCOUNTER — Telehealth: Payer: Self-pay | Admitting: Cardiology

## 2021-10-24 NOTE — Telephone Encounter (Signed)
Advised pt can take her medications prior to cardiolite stress test.

## 2021-10-25 ENCOUNTER — Ambulatory Visit: Payer: Medicare Other

## 2021-10-25 DIAGNOSIS — R0609 Other forms of dyspnea: Secondary | ICD-10-CM

## 2021-10-25 DIAGNOSIS — I480 Paroxysmal atrial fibrillation: Secondary | ICD-10-CM

## 2021-10-25 LAB — MYOCARDIAL PERFUSION IMAGING: Rest Nuclear Isotope Dose: 10.7 mCi

## 2021-10-25 MED ORDER — TECHNETIUM TC 99M TETROFOSMIN IV KIT: 32.5000 | PACK | Freq: Once | INTRAVENOUS | Status: DC | PRN

## 2021-10-25 MED ORDER — TECHNETIUM TC 99M TETROFOSMIN IV KIT
10.7000 | PACK | Freq: Once | INTRAVENOUS | Status: AC | PRN
  Administered 2021-10-25: 10.7 via INTRAVENOUS

## 2021-10-26 ENCOUNTER — Telehealth: Payer: Self-pay

## 2021-10-26 DIAGNOSIS — R0609 Other forms of dyspnea: Secondary | ICD-10-CM

## 2021-10-26 DIAGNOSIS — R0602 Shortness of breath: Secondary | ICD-10-CM | POA: Diagnosis not present

## 2021-10-26 DIAGNOSIS — I480 Paroxysmal atrial fibrillation: Secondary | ICD-10-CM

## 2021-10-26 MED ORDER — METOPROLOL SUCCINATE ER 25 MG PO TB24: 25.0000 mg | ORAL_TABLET | Freq: Every day | ORAL | 0 refills | Status: DC

## 2021-10-26 MED ORDER — FLECAINIDE ACETATE 150 MG PO TABS: 150.0000 mg | ORAL_TABLET | Freq: Two times a day (BID) | ORAL | Status: DC

## 2021-10-26 MED ORDER — METOPROLOL SUCCINATE ER 25 MG PO TB24: 25.0000 mg | ORAL_TABLET | Freq: Every day | ORAL | 3 refills | Status: DC

## 2021-10-26 NOTE — Addendum Note (Signed)
Addended by: Dandra Velardi, Elmarie Shiley L on: 10/26/2021 04:51 PM   Modules accepted: Orders

## 2021-10-26 NOTE — Telephone Encounter (Signed)
Advised to increase Flecainide to 150 mg BID and start Toprol XL 25 mg daily. Pt will come in for a nurse visit 10/30/21 and Dr. Tomie China will decide if pt needs to complete her lexiscan.

## 2021-10-27 DIAGNOSIS — I129 Hypertensive chronic kidney disease with stage 1 through stage 4 chronic kidney disease, or unspecified chronic kidney disease: Secondary | ICD-10-CM | POA: Diagnosis not present

## 2021-10-28 DIAGNOSIS — R7989 Other specified abnormal findings of blood chemistry: Secondary | ICD-10-CM | POA: Diagnosis not present

## 2021-10-28 DIAGNOSIS — I959 Hypotension, unspecified: Secondary | ICD-10-CM | POA: Diagnosis not present

## 2021-10-28 DIAGNOSIS — I459 Conduction disorder, unspecified: Secondary | ICD-10-CM | POA: Diagnosis not present

## 2021-10-28 DIAGNOSIS — R001 Bradycardia, unspecified: Secondary | ICD-10-CM | POA: Diagnosis not present

## 2021-10-29 DIAGNOSIS — R7989 Other specified abnormal findings of blood chemistry: Secondary | ICD-10-CM | POA: Diagnosis not present

## 2021-10-29 DIAGNOSIS — R7401 Elevation of levels of liver transaminase levels: Secondary | ICD-10-CM | POA: Diagnosis not present

## 2021-10-29 DIAGNOSIS — E785 Hyperlipidemia, unspecified: Secondary | ICD-10-CM | POA: Diagnosis not present

## 2021-10-29 DIAGNOSIS — I4510 Unspecified right bundle-branch block: Secondary | ICD-10-CM | POA: Diagnosis not present

## 2021-10-29 DIAGNOSIS — T462X5A Adverse effect of other antidysrhythmic drugs, initial encounter: Secondary | ICD-10-CM | POA: Diagnosis not present

## 2021-10-29 DIAGNOSIS — I9589 Other hypotension: Secondary | ICD-10-CM | POA: Diagnosis not present

## 2021-10-29 DIAGNOSIS — R001 Bradycardia, unspecified: Secondary | ICD-10-CM | POA: Diagnosis not present

## 2021-10-29 DIAGNOSIS — E7800 Pure hypercholesterolemia, unspecified: Secondary | ICD-10-CM | POA: Diagnosis not present

## 2021-10-29 DIAGNOSIS — I480 Paroxysmal atrial fibrillation: Secondary | ICD-10-CM | POA: Diagnosis not present

## 2021-10-29 DIAGNOSIS — R42 Dizziness and giddiness: Secondary | ICD-10-CM | POA: Diagnosis not present

## 2021-10-29 DIAGNOSIS — I459 Conduction disorder, unspecified: Secondary | ICD-10-CM | POA: Diagnosis not present

## 2021-10-29 DIAGNOSIS — I10 Essential (primary) hypertension: Secondary | ICD-10-CM | POA: Diagnosis not present

## 2021-10-29 DIAGNOSIS — I959 Hypotension, unspecified: Secondary | ICD-10-CM | POA: Diagnosis not present

## 2021-10-29 DIAGNOSIS — R945 Abnormal results of liver function studies: Secondary | ICD-10-CM | POA: Diagnosis not present

## 2021-10-29 DIAGNOSIS — R0789 Other chest pain: Secondary | ICD-10-CM | POA: Diagnosis not present

## 2021-10-29 DIAGNOSIS — Z7901 Long term (current) use of anticoagulants: Secondary | ICD-10-CM | POA: Diagnosis not present

## 2021-10-29 DIAGNOSIS — K5730 Diverticulosis of large intestine without perforation or abscess without bleeding: Secondary | ICD-10-CM | POA: Diagnosis not present

## 2021-10-29 DIAGNOSIS — Z79899 Other long term (current) drug therapy: Secondary | ICD-10-CM | POA: Diagnosis not present

## 2021-10-30 ENCOUNTER — Ambulatory Visit: Payer: Medicare Other

## 2021-10-30 DIAGNOSIS — I4510 Unspecified right bundle-branch block: Secondary | ICD-10-CM | POA: Diagnosis not present

## 2021-10-30 DIAGNOSIS — R0789 Other chest pain: Secondary | ICD-10-CM | POA: Diagnosis not present

## 2021-10-30 DIAGNOSIS — I480 Paroxysmal atrial fibrillation: Secondary | ICD-10-CM | POA: Diagnosis not present

## 2021-10-30 DIAGNOSIS — R001 Bradycardia, unspecified: Secondary | ICD-10-CM | POA: Diagnosis not present

## 2021-10-30 DIAGNOSIS — T462X5A Adverse effect of other antidysrhythmic drugs, initial encounter: Secondary | ICD-10-CM | POA: Diagnosis not present

## 2021-10-30 DIAGNOSIS — I10 Essential (primary) hypertension: Secondary | ICD-10-CM | POA: Diagnosis not present

## 2021-10-30 DIAGNOSIS — E785 Hyperlipidemia, unspecified: Secondary | ICD-10-CM | POA: Diagnosis not present

## 2021-10-30 DIAGNOSIS — R945 Abnormal results of liver function studies: Secondary | ICD-10-CM | POA: Diagnosis not present

## 2021-10-31 DIAGNOSIS — E785 Hyperlipidemia, unspecified: Secondary | ICD-10-CM | POA: Diagnosis not present

## 2021-10-31 DIAGNOSIS — I480 Paroxysmal atrial fibrillation: Secondary | ICD-10-CM | POA: Diagnosis not present

## 2021-10-31 DIAGNOSIS — I4510 Unspecified right bundle-branch block: Secondary | ICD-10-CM | POA: Diagnosis not present

## 2021-10-31 DIAGNOSIS — R0789 Other chest pain: Secondary | ICD-10-CM | POA: Diagnosis not present

## 2021-10-31 DIAGNOSIS — R945 Abnormal results of liver function studies: Secondary | ICD-10-CM | POA: Diagnosis not present

## 2021-10-31 DIAGNOSIS — T462X5A Adverse effect of other antidysrhythmic drugs, initial encounter: Secondary | ICD-10-CM | POA: Diagnosis not present

## 2021-10-31 DIAGNOSIS — R001 Bradycardia, unspecified: Secondary | ICD-10-CM | POA: Diagnosis not present

## 2021-10-31 DIAGNOSIS — I10 Essential (primary) hypertension: Secondary | ICD-10-CM | POA: Diagnosis not present

## 2021-11-01 DIAGNOSIS — T462X5A Adverse effect of other antidysrhythmic drugs, initial encounter: Secondary | ICD-10-CM | POA: Diagnosis not present

## 2021-11-01 DIAGNOSIS — I10 Essential (primary) hypertension: Secondary | ICD-10-CM | POA: Diagnosis not present

## 2021-11-01 DIAGNOSIS — R0789 Other chest pain: Secondary | ICD-10-CM | POA: Diagnosis not present

## 2021-11-01 DIAGNOSIS — E785 Hyperlipidemia, unspecified: Secondary | ICD-10-CM | POA: Diagnosis not present

## 2021-11-01 DIAGNOSIS — R001 Bradycardia, unspecified: Secondary | ICD-10-CM | POA: Diagnosis not present

## 2021-11-01 DIAGNOSIS — I4510 Unspecified right bundle-branch block: Secondary | ICD-10-CM | POA: Diagnosis not present

## 2021-11-01 DIAGNOSIS — I480 Paroxysmal atrial fibrillation: Secondary | ICD-10-CM | POA: Diagnosis not present

## 2021-11-01 DIAGNOSIS — R945 Abnormal results of liver function studies: Secondary | ICD-10-CM | POA: Diagnosis not present

## 2021-11-02 ENCOUNTER — Telehealth: Payer: Self-pay

## 2021-11-02 DIAGNOSIS — R945 Abnormal results of liver function studies: Secondary | ICD-10-CM | POA: Diagnosis not present

## 2021-11-02 DIAGNOSIS — I4510 Unspecified right bundle-branch block: Secondary | ICD-10-CM | POA: Diagnosis not present

## 2021-11-02 DIAGNOSIS — T462X5A Adverse effect of other antidysrhythmic drugs, initial encounter: Secondary | ICD-10-CM | POA: Diagnosis not present

## 2021-11-02 DIAGNOSIS — R0789 Other chest pain: Secondary | ICD-10-CM | POA: Diagnosis not present

## 2021-11-02 DIAGNOSIS — I10 Essential (primary) hypertension: Secondary | ICD-10-CM | POA: Diagnosis not present

## 2021-11-02 DIAGNOSIS — I480 Paroxysmal atrial fibrillation: Secondary | ICD-10-CM

## 2021-11-02 DIAGNOSIS — E785 Hyperlipidemia, unspecified: Secondary | ICD-10-CM | POA: Diagnosis not present

## 2021-11-02 DIAGNOSIS — R001 Bradycardia, unspecified: Secondary | ICD-10-CM | POA: Diagnosis not present

## 2021-11-02 NOTE — Telephone Encounter (Signed)
Message from Dr. Bing Matter to schedule pt with Dr. Elberta Fortis ASAP for A fib ablation. Message sent to [facility] Price RN and Harrietta Guardian, EP Schedduler for appt.

## 2021-11-06 ENCOUNTER — Ambulatory Visit (INDEPENDENT_AMBULATORY_CARE_PROVIDER_SITE_OTHER): Payer: Medicare Other | Admitting: Cardiology

## 2021-11-06 ENCOUNTER — Encounter: Payer: Self-pay | Admitting: Cardiology

## 2021-11-06 VITALS — BP 110/72 | HR 93 | Ht 64.25 in | Wt 150.0 lb

## 2021-11-06 DIAGNOSIS — E782 Mixed hyperlipidemia: Secondary | ICD-10-CM

## 2021-11-06 DIAGNOSIS — I10 Essential (primary) hypertension: Secondary | ICD-10-CM

## 2021-11-06 DIAGNOSIS — R7989 Other specified abnormal findings of blood chemistry: Secondary | ICD-10-CM | POA: Diagnosis not present

## 2021-11-06 DIAGNOSIS — I480 Paroxysmal atrial fibrillation: Secondary | ICD-10-CM

## 2021-11-06 DIAGNOSIS — R0609 Other forms of dyspnea: Secondary | ICD-10-CM

## 2021-11-06 MED ORDER — METOPROLOL TARTRATE 25 MG PO TABS: 25.0000 mg | ORAL_TABLET | Freq: Three times a day (TID) | ORAL | 3 refills | Status: DC

## 2021-11-06 NOTE — Progress Notes (Unsigned)
Cardiology Office Note:    Date:  11/06/2021   ID:  Kim Wang, [place] 1945-10-28, MRN [id number]  PCP:  Abner Greenspan, MD  Cardiologist:  Gypsy Balsam, MD    Referring MD: Abner Greenspan, MD   Chief Complaint  Patient presents with   hospital visit  Doing better  History of Present Illness:    Kim Wang is a 76 y.o. female past medical history significant for paroxysmal atrial fibrillation, essential hypertension, hyperlipidemia.  She presented recently to the hospital being in junctional escape rhythm.  Apparently her dose of flecainide has been increased 250 twice daily then she was also put on a high dose of metoprolol and up coming to the hospital with very slow heart rate and low blood pressure.  Dose medication has been withdrawn she recovered.  Also she was noted to have high level of transaminase.  Up to 600.  Lipitor has been withdrawn.  She was gradually put back on AV blocking agent she went back to atrial fibrillation flecainide has been withdrawn she is coming today to follow-up.  Doing well still complain of having some palpitation but no dizziness no passing out.  Past Medical History:  Diagnosis Date   Hyperlipidemia    on meds   Hypertension    on meds   Osteoporosis    bilateral hips    Past Surgical History:  Procedure Laterality Date   COLONOSCOPY  2012   RG-normal/tics- 10 yr recall   TUMOR REMOVAL Left    LEFT leg lymphoid tumor removed    Current Medications: Current Meds  Medication Sig   apixaban (ELIQUIS) 5 MG TABS tablet Take 1 tablet (5 mg total) by mouth 2 (two) times daily.   Calcium Carbonate-Vitamin D 600-200 MG-UNIT TABS Take 2 tablets by mouth daily at 6 (six) AM.   diltiazem (DILACOR XR) 180 MG 24 hr capsule Take 1 capsule (180 mg total) by mouth daily. (Patient taking differently: Take 240 mg by mouth daily.)   metoprolol tartrate (LOPRESSOR) 25 MG tablet Take 25 mg by mouth 2 (two) times daily.   Omega 3 1200 MG CAPS Take 1  capsule by mouth daily at 6 (six) AM.   [DISCONTINUED] atorvastatin (LIPITOR) 40 MG tablet Take 1 tablet (40 mg total) by mouth daily.   [DISCONTINUED] diphenhydrAMINE (SOMINEX) 25 MG tablet Take 25 mg by mouth at bedtime as needed for sleep.   [DISCONTINUED] ezetimibe (ZETIA) 10 MG tablet Take 10 mg by mouth daily.   [DISCONTINUED] flecainide (TAMBOCOR) 150 MG tablet Take 1 tablet (150 mg total) by mouth 2 (two) times daily.   [DISCONTINUED] guaiFENesin (MUCINEX PO) Take 1 tablet by mouth daily as needed (cough AND MUCUS).   [DISCONTINUED] lisinopril (ZESTRIL) 10 MG tablet Take 4 tablets (40 mg total) by mouth daily.   [DISCONTINUED] metoprolol succinate (TOPROL XL) 25 MG 24 hr tablet Take 1 tablet (25 mg total) by mouth daily.   [DISCONTINUED] metoprolol succinate (TOPROL XL) 25 MG 24 hr tablet Take 1 tablet (25 mg total) by mouth daily.   [DISCONTINUED] valACYclovir (VALTREX) 1000 MG tablet Take 1,000 mg by mouth 3 (three) times daily as needed for other. Cold sores   Current Facility-Administered Medications for the 11/06/21 encounter (Office Visit) with Georgeanna Lea, MD  Medication   technetium tetrofosmin (TC-MYOVIEW) injection 32.5 millicurie     Allergies:   Flecainide   Social History   Socioeconomic History   Marital status: Married    Spouse name: Not on  file   Number of children: Not on file   Years of education: Not on file   Highest education level: Not on file  Occupational History   Not on file  Tobacco Use   Smoking status: Never   Smokeless tobacco: Never  Vaping Use   Vaping Use: Never used  Substance and Sexual Activity   Alcohol use: Yes    Alcohol/week: 7.0 standard drinks of alcohol    Types: 7 Standard drinks or equivalent per week   Drug use: Never   Sexual activity: Yes  Other Topics Concern   Not on file  Social History Narrative   Not on file   Social Determinants of Health   Financial Resource Strain: Not on file  Food Insecurity: Not  on file  Transportation Needs: Not on file  Physical Activity: Not on file  Stress: Not on file  Social Connections: Not on file     Family History: The patient's family history includes Breast cancer in her mother; Bronchitis in her father; Chronic bronchitis in her father. There is no history of Colon polyps, Colon cancer, Esophageal cancer, Prostate cancer, Rectal cancer, or Stomach cancer. ROS:   Please see the history of present illness.    All 14 point review of systems negative except as described per history of present illness  EKGs/Labs/Other Studies Reviewed:      Recent Labs: 10/13/2021: BUN 16; Creatinine, Ser 0.87; Magnesium 2.1; Potassium 4.2; Sodium 136  Recent Lipid Panel No results found for: "CHOL", "TRIG", "HDL", "CHOLHDL", "VLDL", "LDLCALC", "LDLDIRECT"  Physical Exam:    VS:  BP 110/72 (BP Location: Left Arm, Patient Position: Sitting)   Pulse 93   Ht 5' 4.25" (1.632 m)   Wt 150 lb (68 kg)   SpO2 97%   BMI 25.55 kg/m     Wt Readings from Last 3 Encounters:  11/06/21 150 lb (68 kg)  10/25/21 155 lb (70.3 kg)  10/13/21 155 lb 12.8 oz (70.7 kg)     GEN:  Well nourished, well developed in no acute distress HEENT: Normal NECK: No JVD; No carotid bruits LYMPHATICS: No lymphadenopathy CARDIAC: Irregularly irregular, no murmurs, no rubs, no gallops RESPIRATORY:  Clear to auscultation without rales, wheezing or rhonchi  ABDOMEN: Soft, non-tender, non-distended MUSCULOSKELETAL:  No edema; No deformity  SKIN: Warm and dry LOWER EXTREMITIES: no swelling NEUROLOGIC:  Alert and oriented x 3 PSYCHIATRIC:  Normal affect   ASSESSMENT:    1. Paroxysmal atrial fibrillation (HCC)   2. Primary hypertension   3. Dyspnea on exertion   4. Mixed hyperlipidemia    PLAN:    In order of problems listed above:  Persistent atrial fibrillation, solution is atrial fibrillation ablation.  She will be referred to our EP clinic.  I will try to asked him to see her as  quickly as possible.  In the meantime I will increase gently her beta-blocker from 25 twice daily to 25 3 times a day to help with the heart rate.  In the meantime we will continue with Cardizem CD 240 daily continue anticoagulation Liver dysfunction she did have elevation of AST ALT to 600.  We thinking this is related to low blood pressure low heart rate that is also gradually getting better to close to being normal upon discharge from hospital, I will ask her to have a is TLT repeated.  If that is normal restart Lipitor Dyspnea exertion related to atrial fibrillation Essential hypertension blood pressure well controlled I did review record  from hospital for that visit   Medication Adjustments/Labs and Tests Ordered: Current medicines are reviewed at length with the patient today.  Concerns regarding medicines are outlined above.  No orders of the defined types were placed in this encounter.  Medication changes: No orders of the defined types were placed in this encounter.   Signed, Georgeanna Lea, MD, [facility] 11/06/2021 2:04 PM    [facility]

## 2021-11-06 NOTE — Patient Instructions (Addendum)
Medication Instructions:  Your physician has recommended you make the following change in your medication: INCREASE: Metoprolol 25mg  to 1 tablet 3 times daily by mouth    Lab Work: AST, ALT- today [address] If you have labs (blood work) drawn today and your tests are completely normal, you will receive your results only by: MyChart Message (if you have MyChart) OR A paper copy in the mail If you have any lab test that is abnormal or we need to change your treatment, we will call you to review the results.   Testing/Procedures: None Ordered   Follow-Up: At [facility], you and your health needs are our priority.  As part of our continuing mission to provide you with exceptional heart care, we have created designated Provider Care Teams.  These Care Teams include your primary Cardiologist (physician) and Advanced Practice Providers (APPs -  Physician Assistants and Nurse Practitioners) who all work together to provide you with the care you need, when you need it.  We recommend signing up for the patient portal called "MyChart".  Sign up information is provided on this After Visit Summary.  MyChart is used to connect with patients for Virtual Visits (Telemedicine).  Patients are able to view lab/test results, encounter notes, upcoming appointments, etc.  Non-urgent messages can be sent to your provider as well.   To learn more about what you can do with MyChart, go to [facility].    Your next appointment:   6 week(s)  The format for your next appointment:   In Person  Provider:   [email or web address], MD    Other Instructions NA

## 2021-11-07 DIAGNOSIS — I4891 Unspecified atrial fibrillation: Secondary | ICD-10-CM | POA: Diagnosis not present

## 2021-11-07 DIAGNOSIS — N182 Chronic kidney disease, stage 2 (mild): Secondary | ICD-10-CM | POA: Diagnosis not present

## 2021-11-07 DIAGNOSIS — I129 Hypertensive chronic kidney disease with stage 1 through stage 4 chronic kidney disease, or unspecified chronic kidney disease: Secondary | ICD-10-CM | POA: Diagnosis not present

## 2021-11-07 DIAGNOSIS — Z6825 Body mass index (BMI) 25.0-25.9, adult: Secondary | ICD-10-CM | POA: Diagnosis not present

## 2021-11-07 LAB — AST: AST: 20 IU/L (ref 0–40)

## 2021-11-07 LAB — ALT: ALT: 63 IU/L — ABNORMAL HIGH (ref 0–32)

## 2021-11-14 ENCOUNTER — Other Ambulatory Visit: Payer: Self-pay

## 2021-11-14 MED ORDER — METOPROLOL TARTRATE 25 MG PO TABS: 25.0000 mg | ORAL_TABLET | Freq: Three times a day (TID) | ORAL | 3 refills | Status: DC

## 2021-11-15 NOTE — Addendum Note (Signed)
Addended by: Eleonore Chiquito on: 11/15/2021 07:24 AM   Modules accepted: Orders

## 2021-11-20 ENCOUNTER — Encounter: Payer: Self-pay | Admitting: *Deleted

## 2021-11-20 ENCOUNTER — Ambulatory Visit (INDEPENDENT_AMBULATORY_CARE_PROVIDER_SITE_OTHER): Payer: Medicare Other | Admitting: Cardiology

## 2021-11-20 ENCOUNTER — Encounter: Payer: Self-pay | Admitting: Cardiology

## 2021-11-20 VITALS — BP 108/62 | HR 81 | Ht 64.25 in | Wt 152.4 lb

## 2021-11-20 DIAGNOSIS — Z01812 Encounter for preprocedural laboratory examination: Secondary | ICD-10-CM | POA: Diagnosis not present

## 2021-11-20 DIAGNOSIS — I4819 Other persistent atrial fibrillation: Secondary | ICD-10-CM

## 2021-11-20 MED ORDER — MULTAQ 400 MG PO TABS: 400.0000 mg | ORAL_TABLET | Freq: Two times a day (BID) | ORAL | 3 refills | Status: DC

## 2021-11-20 NOTE — Progress Notes (Signed)
Electrophysiology Office Note   Date:  11/20/2021   ID:  Kim, Wang 23-Jan-1946, MRN [id number]  PCP:  Kim Greenspan, MD  Cardiologist:  Kim Wang Primary Electrophysiologist:  Kim Thomassen Jorja Loa, MD    Chief Complaint: AF   History of Present Illness: Kim Wang is a 76 y.o. female who is being seen today for the evaluation of AF at the request of Kim Lea, MD. Presenting today for electrophysiology evaluation.  She has a history significant for atrial fibrillation, hypertension, hyperlipidemia.  She was initially started on flecainide for her atrial fibrillation.  Her flecainide dose had been increased and she was taking 250 mg twice daily as well as metoprolol.  She presented to [facility] with a junctional escape rhythm and heart rates in the 20s.  Metoprolol and flecainide were both stopped and her heart rate improved.  She was discharged.  Since discharge she has had more frequent episodes of atrial fibrillation.  She feels anxious, dizzy, fatigue due to her episodes.  Today, she denies symptoms of palpitations, chest pain, shortness of breath, orthopnea, PND, lower extremity edema, claudication, dizziness, presyncope, syncope, bleeding, or neurologic sequela. The patient is tolerating medications without difficulties.    Past Medical History:  Diagnosis Date   Hyperlipidemia    on meds   Hypertension    on meds   Osteoporosis    bilateral hips   Past Surgical History:  Procedure Laterality Date   COLONOSCOPY  2012   RG-normal/tics- 10 yr recall   TUMOR REMOVAL Left    LEFT leg lymphoid tumor removed     Current Outpatient Medications  Medication Sig Dispense Refill   apixaban (ELIQUIS) 5 MG TABS tablet Take 1 tablet (5 mg total) by mouth 2 (two) times daily. 180 tablet 3   Calcium Carbonate-Vitamin D 600-200 MG-UNIT TABS Take 2 tablets by mouth daily at 6 (six) AM.     diltiazem (CARDIZEM SR) 120 MG 12 hr capsule Take 120 mg by  mouth daily.     metoprolol tartrate (LOPRESSOR) 25 MG tablet Take 1 tablet (25 mg total) by mouth 3 (three) times daily. 270 tablet 3   Omega 3 1200 MG CAPS Take 1 capsule by mouth daily at 6 (six) AM.     dronedarone (MULTAQ) 400 MG tablet Take 1 tablet (400 mg total) by mouth 2 (two) times daily with a meal. 60 tablet 3   Current Facility-Administered Medications  Medication Dose Route Frequency Provider Last Rate Last Admin   technetium tetrofosmin (TC-MYOVIEW) injection 32.5 millicurie  32.5 millicurie Intravenous Once PRN Kim Lea, MD        Allergies:   Flecainide   Social History:  The patient  reports that she has never smoked. She has never used smokeless tobacco. She reports current alcohol use of about 7.0 standard drinks of alcohol per week. She reports that she does not use drugs.   Family History:  The patient's family history includes Breast cancer in her mother; Bronchitis in her father; Chronic bronchitis in her father.    ROS:  Please see the history of present illness.   Otherwise, review of systems is positive for none.   All other systems are reviewed and negative.    PHYSICAL EXAM: VS:  BP 108/62   Pulse 81   Ht 5' 4.25" (1.632 m)   Wt 152 lb 6.4 oz (69.1 kg)   SpO2 98%   BMI 25.96 kg/m  , BMI Body mass index  is 25.96 kg/m. GEN: Well nourished, well developed, in no acute distress  HEENT: normal  Neck: no JVD, carotid bruits, or masses Cardiac: RRR; no murmurs, rubs, or gallops,no edema  Respiratory:  clear to auscultation bilaterally, normal work of breathing GI: soft, nontender, nondistended, + BS MS: no deformity or atrophy  Skin: warm and dry Neuro:  Strength and sensation are intact Psych: euthymic mood, full affect  EKG:  EKG is ordered today. Personal review of the ekg ordered shows sinus rhythm, PACs  Recent Labs: 10/13/2021: BUN 16; Creatinine, Ser 0.87; Magnesium 2.1; Potassium 4.2; Sodium 136 11/06/2021: ALT 63    Lipid Panel   No results found for: "CHOL", "TRIG", "HDL", "CHOLHDL", "VLDL", "LDLCALC", "LDLDIRECT"   Wt Readings from Last 3 Encounters:  11/20/21 152 lb 6.4 oz (69.1 kg)  11/06/21 150 lb (68 kg)  10/25/21 155 lb (70.3 kg)      Other studies Reviewed: Additional studies/ records that were reviewed today include: TTE 09/30/21  Review of the above records today demonstrates:  Mild concentric LVH Ejection fraction 60 to 65% Mildly dilated left atrium  ASSESSMENT AND PLAN:  1.  Paroxysmal atrial fibrillation: CHA2DS2-VASc of 3.  Currently on diltiazem to 40 mg daily, metoprolol 25 mg 3 times daily, Eliquis 5 mg twice daily.  She would prefer a rhythm control strategy as feels she feels quite poorly in atrial fibrillation.  We Kim Wang start her on Multaq.  She would also prefer to be off of long-term antiarrhythmics.  We Kim Wang plan for ablation.  Risk, benefits, and alternatives to EP study and radiofrequency ablation for afib were also discussed in detail today. These risks include but are not limited to stroke, bleeding, vascular damage, tamponade, perforation, damage to the esophagus, lungs, and other structures, pulmonary vein stenosis, worsening renal function, and death. The patient understands these risk and wishes to proceed.  We Kim Wang therefore proceed with catheter ablation at the next available time.  Carto, ICE, anesthesia are requested for the procedure.  Kim Wang also obtain CT PV protocol prior to the procedure to exclude LAA thrombus and further evaluate atrial anatomy.   2.  Hypertension: Currently well controlled  3.  Secondary hypercoagulable state: Currently on Eliquis for atrial fibrillation as above  Case discussed with primary cardiology  Current medicines are reviewed at length with the patient today.   The patient does not have concerns regarding her medicines.  The following changes were made today: Start Multaq  Labs/ tests ordered today include:  Orders Placed This Encounter   Procedures   CT CARDIAC MORPH/PULM VEIN W/CM&W/O CA SCORE   Basic metabolic panel   CBC     Disposition:   FU with Kim Wang 3 months  Signed, Kim Licea Jorja Loa, MD  11/20/2021 3:13 PM     [facility] [address] [place] [address] [phone] (office) [phone] (fax)

## 2021-11-20 NOTE — Patient Instructions (Addendum)
Medication Instructions:  Your physician has recommended you make the following change in your medication:  STOP Metoprolol (Lopressor) START Multaq 400 mg twice daily  *If you need a refill on your cardiac medications before your next appointment, please call your pharmacy*   Lab Work: Pre procedure labs -- see procedure instruction letter:  BMP & CBC  If you have labs (blood work) drawn today and your tests are completely normal, you will receive your results only by: MyChart Message (if you have MyChart) OR A paper copy in the mail If you have any lab test that is abnormal or we need to change your treatment, we will call you to review the results.   Testing/Procedures: Your physician has requested that you have cardiac CT within 7 days PRIOR to your ablation. Cardiac computed tomography (CT) is a painless test that uses an x-ray machine to take clear, detailed pictures of your heart.  Please follow instruction below located under "other instructions". You will get a call from our office to schedule the date for this test.  Your physician has recommended that you have an ablation. Catheter ablation is a medical procedure used to treat some cardiac arrhythmias (irregular heartbeats). During catheter ablation, a long, thin, flexible tube is put into a blood vessel in your groin (upper thigh), or neck. This tube is called an ablation catheter. It is then guided to your heart through the blood vessel. Radio frequency waves destroy small areas of heart tissue where abnormal heartbeats may cause an arrhythmia to start. Please follow instruction letter given to you today.   Follow-Up: At [facility], you and your health needs are our priority.  As part of our continuing mission to provide you with exceptional heart care, we have created designated Provider Care Teams.  These Care Teams include your primary Cardiologist (physician) and Advanced Practice Providers (APPs -  Physician Assistants  and Nurse Practitioners) who all work together to provide you with the care you need, when you need it.  Your next appointment:   1 month(s) after your ablation  The format for your next appointment:   In Person  Provider:   [facility]   Thank you for choosing [facility]!!   Dory Horn, RN [phone]    Other Instructions  Cardiac Ablation Cardiac ablation is a procedure to destroy (ablate) some heart tissue that is sending bad signals. These bad signals cause problems in heart rhythm. The heart has many areas that make these signals. If there are problems in these areas, they can make the heart beat in a way that is not normal. Destroying some tissues can help make the heart rhythm normal. Tell your doctor about: Any allergies you have. All medicines you are taking. These include vitamins, herbs, eye drops, creams, and over-the-counter medicines. Any problems you or family members have had with medicines that make you fall asleep (anesthetics). Any blood disorders you have. Any surgeries you have had. Any medical conditions you have, such as kidney failure. Whether you are pregnant or may be pregnant. What are the risks? This is a safe procedure. But problems may occur, including: Infection. Bruising and bleeding. Bleeding into the chest. Stroke or blood clots. Damage to nearby areas of your body. Allergies to medicines or dyes. The need for a pacemaker if the normal system is damaged. Failure of the procedure to treat the problem. What happens before the procedure? Medicines Ask your doctor about: Changing or stopping your normal medicines. This is important.  Taking aspirin and ibuprofen. Do not take these medicines unless your doctor tells you to take them. Taking other medicines, vitamins, herbs, and supplements. General instructions Follow instructions from your doctor about what you cannot eat or drink. Plan to have someone take you home from the  hospital or clinic. If you will be going home right after the procedure, plan to have someone with you for 24 hours. Ask your doctor what steps will be taken to prevent infection. What happens during the procedure?  An IV tube will be put into one of your veins. You will be given a medicine to help you relax. The skin on your neck or groin will be numbed. A cut (incision) will be made in your neck or groin. A needle will be put through your cut and into a large vein. A tube (catheter) will be put into the needle. The tube will be moved to your heart. Dye may be put through the tube. This helps your doctor see your heart. Small devices (electrodes) on the tube will send out signals. A type of energy will be used to destroy some heart tissue. The tube will be taken out. Pressure will be held on your cut. This helps stop bleeding. A bandage will be put over your cut. The exact procedure may vary among doctors and hospitals. What happens after the procedure? You will be watched until you leave the hospital or clinic. This includes checking your heart rate, breathing rate, oxygen, and blood pressure. Your cut will be watched for bleeding. You will need to lie still for a few hours. Do not drive for 24 hours or as long as your doctor tells you. Summary Cardiac ablation is a procedure to destroy some heart tissue. This is done to treat heart rhythm problems. Tell your doctor about any medical conditions you may have. Tell him or her about all medicines you are taking to treat them. This is a safe procedure. But problems may occur. These include infection, bruising, bleeding, and damage to nearby areas of your body. Follow what your doctor tells you about food and drink. You may also be told to change or stop some of your medicines. After the procedure, do not drive for 24 hours or as long as your doctor tells you. This information is not intended to replace advice given to you by your health care  provider. Make sure you discuss any questions you have with your health care provider. Document Revised: 03/19/2019 Document Reviewed: 03/19/2019 Elsevier Patient Education  2023 [employer].

## 2021-11-21 ENCOUNTER — Telehealth: Payer: Self-pay | Admitting: Cardiology

## 2021-11-21 NOTE — Telephone Encounter (Signed)
Pt c/o medication issue:  1. Name of Medication: Fish oil and Calcium supplements   2. How are you currently taking this medication (dosage and times per day)? Every day  3. Are you having a reaction (difficulty breathing--STAT)?   4. What is your medication issue? Pt states that Dr. Elberta Fortis saw her in office yesterday and took her off of Multaq 400 mg and Metoprolol tartrate 25 mg, but she forgot to mention that she also take fish oil and calcium supplements and would like to know if it is okay to continue taking these supplements. Please advise.

## 2021-11-21 NOTE — Addendum Note (Signed)
Addended by: Mariam Dollar on: 11/21/2021 09:28 AM   Modules accepted: Orders

## 2021-11-22 NOTE — Telephone Encounter (Signed)
Pt advised ok to take supplements per Camnitz. Patient verbalized understanding and agreeable to plan.

## 2021-11-27 DIAGNOSIS — I129 Hypertensive chronic kidney disease with stage 1 through stage 4 chronic kidney disease, or unspecified chronic kidney disease: Secondary | ICD-10-CM | POA: Diagnosis not present

## 2021-12-05 ENCOUNTER — Telehealth: Payer: Self-pay | Admitting: Cardiology

## 2021-12-05 ENCOUNTER — Institutional Professional Consult (permissible substitution): Payer: Medicare Other | Admitting: Cardiology

## 2021-12-05 NOTE — Telephone Encounter (Signed)
Pt c/o medication issue:  1. Name of Medication:  metoprolol tartrate (LOPRESSOR) 25 MG tablet  2. How are you currently taking this medication (dosage and times per day)?   3. Are you having a reaction (difficulty breathing--STAT)?   4. What is your medication issue?   Patient states last Friday, 7/28 her HR got up to 111 and BP was 157/107. She states a provider from her PCP office advised her to split it in half and her BP/HR went down. She assumes this method will be more beneficial. Okay to take have of Metoprolol with Multaq?

## 2021-12-05 NOTE — Telephone Encounter (Signed)
Return call to patient. Patient reports she had an episode of tachycardia 12/02/21 with a heart rate of 130. She called her PCP after hours and Dr. Leretha Pol advised her to take one dose of metoprolol 25mg . She did this and her heart rate dropped to 57 after about 1.5 hours. She did follow up with another phone call to Dr. to make her aware.   Her VS today are HR 61, BP 115/75.  Her concern today is can she take metoprolol 12.5 or 25mg  if this happens again in the future? She was started on Multaq 400mg  in July and reports it is working well with the exception of this past weekend.   She also reviewed her medications in my chart and states she is taking diltiazem 240mg , not 120mg  and wanted to make aware.   I advised her I would forward to Dr. for review.

## 2021-12-06 DIAGNOSIS — I129 Hypertensive chronic kidney disease with stage 1 through stage 4 chronic kidney disease, or unspecified chronic kidney disease: Secondary | ICD-10-CM | POA: Diagnosis not present

## 2021-12-06 NOTE — Telephone Encounter (Signed)
Spoke with husband to give instructions.    "Would be okay with the patient taking 25 mg of metoprolol as needed until ablation" Camnitz, Andree Coss, MD

## 2021-12-06 NOTE — Telephone Encounter (Signed)
Left message for call back. Patient can take metoprolol 25mg  as needed for palpitations.   "Would be okay with the patient taking 25 mg of metoprolol as needed until ablation" Camnitz, , MD

## 2021-12-08 ENCOUNTER — Telehealth: Payer: Self-pay | Admitting: Cardiology

## 2021-12-08 MED ORDER — MULTAQ 400 MG PO TABS: 400.0000 mg | ORAL_TABLET | Freq: Two times a day (BID) | ORAL | 11 refills | Status: DC

## 2021-12-08 NOTE — Telephone Encounter (Signed)
Pt c/o medication issue:  1. Name of Medication: Multaq  2. How are you currently taking this medication (dosage and times per day)? 2 2 times a day  3. Are you having a reaction (difficulty breathing--STAT)?   4. What is your medication issue? She wants to know if she is to keep taking this medicine? If so, she only have 12 tablets  left

## 2021-12-08 NOTE — Telephone Encounter (Signed)
Spoke with the patient who states that she has been taking the Multaq that Dr. Elberta Fortis prescribed and has been tolerating it well. She states that she only has about 12 days left. The original prescription was sent to [facility] so that she could use a coupon that we gave her for a discount. She would like for her refill to be sent into [facility]. I have sent this in for her. She also states that when her heart rate was elevated to 131 the other day and she took an extra metoprolol that it worked and came down to 57. She would like to know if she can continue to do this if needed. Advised her that per Dr. Elberta Fortis she can take extra 25 mg of metoprolol as needed for elevated heart rate until her ablation. Patient verbalized understanding.

## 2021-12-15 ENCOUNTER — Telehealth: Payer: Self-pay | Admitting: Cardiology

## 2021-12-15 NOTE — Telephone Encounter (Signed)
Patient states she was returning call, explain we were just calling for a reminder for her appt on 8/21. Please advise

## 2021-12-18 ENCOUNTER — Encounter: Payer: Self-pay | Admitting: Cardiology

## 2021-12-18 ENCOUNTER — Ambulatory Visit (INDEPENDENT_AMBULATORY_CARE_PROVIDER_SITE_OTHER): Payer: Medicare Other | Admitting: Cardiology

## 2021-12-18 VITALS — BP 128/72 | HR 69 | Ht 64.0 in | Wt 142.8 lb

## 2021-12-18 DIAGNOSIS — I480 Paroxysmal atrial fibrillation: Secondary | ICD-10-CM | POA: Diagnosis not present

## 2021-12-18 DIAGNOSIS — I10 Essential (primary) hypertension: Secondary | ICD-10-CM | POA: Diagnosis not present

## 2021-12-18 DIAGNOSIS — E782 Mixed hyperlipidemia: Secondary | ICD-10-CM

## 2021-12-18 DIAGNOSIS — R0609 Other forms of dyspnea: Secondary | ICD-10-CM | POA: Diagnosis not present

## 2021-12-18 NOTE — Patient Instructions (Signed)

## 2021-12-18 NOTE — Progress Notes (Signed)
Cardiology Office Note:    Date:  12/18/2021   ID:  Kim Wang, [place] [birth year], MRN [id number]  PCP:  Abner Greenspan, MD  Cardiologist:  Gypsy Balsam, MD    Referring MD: Abner Greenspan, MD   Chief Complaint  Patient presents with   elevated HR    History of Present Illness:    Kim Wang is a 76 y.o. female   past medical history significant for paroxysmal atrial fibrillation, essential hypertension, hyperlipidemia.  She presented recently to the hospital being in junctional escape rhythm.  Apparently her dose of flecainide has been increased 250 twice daily then she was also put on a high dose of metoprolol and up coming to the hospital with very slow heart rate and low blood pressure.  Dose medication has been withdrawn she recovered.  Also she was noted to have high level of transaminase.  Up to 600.  Lipitor has been withdrawn.  She was gradually put back on AV blocking agent she went back to atrial fibrillation  She comes today to my office for follow-up overall she is doing well she describes few episodes of atrial fibrillation.  She very appropriate took some dose of metoprolol with good relief.  Usual episode of atrial fibrillation lasting up to 24 hours.  He is anticoagulated which I will continue.  He is scheduled already to have atrial fibrillation ablation in November.  She denies have any chest pain tightness squeezing pressure burning chest  Past Medical History:  Diagnosis Date   Hyperlipidemia    on meds   Hypertension    on meds   Osteoporosis    bilateral hips    Past Surgical History:  Procedure Laterality Date   COLONOSCOPY  2012   RG-normal/tics- 10 yr recall   TUMOR REMOVAL Left    LEFT leg lymphoid tumor removed    Current Medications: Current Meds  Medication Sig   apixaban (ELIQUIS) 5 MG TABS tablet Take 1 tablet (5 mg total) by mouth 2 (two) times daily.   Calcium Carbonate-Vitamin D 600-200 MG-UNIT TABS Take 2 tablets by mouth daily at  6 (six) AM.   diltiazem (CARDIZEM SR) 120 MG 12 hr capsule Take 240 mg by mouth daily.   dronedarone (MULTAQ) 400 MG tablet Take 1 tablet (400 mg total) by mouth 2 (two) times daily with a meal.   metoprolol tartrate (LOPRESSOR) 25 MG tablet Take 1 tablet (25 mg total) by mouth 3 (three) times daily. (Patient taking differently: Take 25 mg by mouth as needed (elevated HR).)   Omega 3 1200 MG CAPS Take 1 capsule by mouth daily at 6 (six) AM.   Current Facility-Administered Medications for the 12/18/21 encounter (Office Visit) with Georgeanna Lea, MD  Medication   technetium tetrofosmin (TC-MYOVIEW) injection 32.5 millicurie     Allergies:   Patient has no active allergies.   Social History   Socioeconomic History   Marital status: Married    Spouse name: Not on file   Number of children: Not on file   Years of education: Not on file   Highest education level: Not on file  Occupational History   Not on file  Tobacco Use   Smoking status: Never   Smokeless tobacco: Never  Vaping Use   Vaping Use: Never used  Substance and Sexual Activity   Alcohol use: Yes    Alcohol/week: 7.0 standard drinks of alcohol    Types: 7 Standard drinks or equivalent per week   Drug  use: Never   Sexual activity: Yes  Other Topics Concern   Not on file  Social History Narrative   Not on file   Social Determinants of Health   Financial Resource Strain: Not on file  Food Insecurity: Not on file  Transportation Needs: Not on file  Physical Activity: Not on file  Stress: Not on file  Social Connections: Not on file     Family History: The patient's family history includes Breast cancer in her mother; Bronchitis in her father; Chronic bronchitis in her father. There is no history of Colon polyps, Colon cancer, Esophageal cancer, Prostate cancer, Rectal cancer, or Stomach cancer. ROS:   Please see the history of present illness.    All 14 point review of systems negative except as described  per history of present illness  EKGs/Labs/Other Studies Reviewed:      Recent Labs: 10/13/2021: BUN 16; Creatinine, Ser 0.87; Magnesium 2.1; Potassium 4.2; Sodium 136 11/06/2021: ALT 63  Recent Lipid Panel No results found for: "CHOL", "TRIG", "HDL", "CHOLHDL", "VLDL", "LDLCALC", "LDLDIRECT"  Physical Exam:    VS:  BP 128/72 (BP Location: Left Arm, Patient Position: Sitting)   Pulse 69   Ht 5\' 4"  (1.626 m)   Wt 142 lb 12.8 oz (64.8 kg)   SpO2 97%   BMI 24.51 kg/m     Wt Readings from Last 3 Encounters:  12/18/21 142 lb 12.8 oz (64.8 kg)  11/20/21 152 lb 6.4 oz (69.1 kg)  11/06/21 150 lb (68 kg)     GEN:  Well nourished, well developed in no acute distress HEENT: Normal NECK: No JVD; No carotid bruits LYMPHATICS: No lymphadenopathy CARDIAC: RRR, no murmurs, no rubs, no gallops RESPIRATORY:  Clear to auscultation without rales, wheezing or rhonchi  ABDOMEN: Soft, non-tender, non-distended MUSCULOSKELETAL:  No edema; No deformity  SKIN: Warm and dry LOWER EXTREMITIES: no swelling NEUROLOGIC:  Alert and oriented x 3 PSYCHIATRIC:  Normal affect   ASSESSMENT:    1. Paroxysmal atrial fibrillation (HCC)   2. Primary hypertension   3. Mixed hyperlipidemia   4. Dyspnea on exertion    PLAN:    In order of problems listed above:  Paroxysmal atrial fibrillation: We will continue present management.  She is on Multaq and seems to be tolerating this medication well.  She is also anticoagulated.  Awaiting ablation.  We discussed issue of atrial fibrillation I told her that she needs to call 911 or go to the emergency room if she have episode of atrial fibrillation and developed shortness of breath chest pain dizziness or passing out. Essential hypertension blood pressure seems to be well controlled continue present management. Dyslipidemia.  I did review her K PN which show me her LDL of 83 HDL 90.  I will continue present management.  Previously she had a problem with elevation  of ALT and AST which we think was related to hypoperfusion related to bradycardia because of excess of flecainide. This been exertion denies having any   Medication Adjustments/Labs and Tests Ordered: Current medicines are reviewed at length with the patient today.  Concerns regarding medicines are outlined above.  No orders of the defined types were placed in this encounter.  Medication changes: No orders of the defined types were placed in this encounter.   Signed, 01/07/22, MD, [facility] 12/18/2021 11:31 AM    [facility]

## 2021-12-21 DIAGNOSIS — E2839 Other primary ovarian failure: Secondary | ICD-10-CM | POA: Diagnosis not present

## 2021-12-21 DIAGNOSIS — M8589 Other specified disorders of bone density and structure, multiple sites: Secondary | ICD-10-CM | POA: Diagnosis not present

## 2021-12-27 ENCOUNTER — Other Ambulatory Visit: Payer: Self-pay

## 2021-12-27 MED ORDER — DILTIAZEM HCL ER 120 MG PO CP12: 240.0000 mg | ORAL_CAPSULE | Freq: Every day | ORAL | 3 refills | Status: DC

## 2021-12-28 DIAGNOSIS — I129 Hypertensive chronic kidney disease with stage 1 through stage 4 chronic kidney disease, or unspecified chronic kidney disease: Secondary | ICD-10-CM | POA: Diagnosis not present

## 2022-01-11 ENCOUNTER — Telehealth: Payer: Self-pay

## 2022-01-11 NOTE — Telephone Encounter (Signed)
[facility] for Diltiazem 240 mg daily.

## 2022-01-15 ENCOUNTER — Other Ambulatory Visit: Payer: Self-pay

## 2022-01-15 MED ORDER — DILTIAZEM HCL ER COATED BEADS 240 MG PO CP24: 240.0000 mg | ORAL_CAPSULE | Freq: Every day | ORAL | 3 refills | Status: DC

## 2022-01-15 NOTE — Telephone Encounter (Signed)
Pt has been taking Diltiazem 120mg  2 capsules daily. Sent Rx to [facility] for Cardizem CD 240mg  #90 with  3 ref. Per Dr. Wendy Poet note.

## 2022-01-18 DIAGNOSIS — I129 Hypertensive chronic kidney disease with stage 1 through stage 4 chronic kidney disease, or unspecified chronic kidney disease: Secondary | ICD-10-CM | POA: Diagnosis not present

## 2022-01-18 DIAGNOSIS — E785 Hyperlipidemia, unspecified: Secondary | ICD-10-CM | POA: Diagnosis not present

## 2022-01-27 DIAGNOSIS — I129 Hypertensive chronic kidney disease with stage 1 through stage 4 chronic kidney disease, or unspecified chronic kidney disease: Secondary | ICD-10-CM | POA: Diagnosis not present

## 2022-01-27 DIAGNOSIS — N179 Acute kidney failure, unspecified: Secondary | ICD-10-CM | POA: Diagnosis not present

## 2022-01-29 ENCOUNTER — Telehealth: Payer: Self-pay | Admitting: Cardiology

## 2022-01-29 DIAGNOSIS — I4891 Unspecified atrial fibrillation: Secondary | ICD-10-CM | POA: Diagnosis not present

## 2022-01-29 DIAGNOSIS — E785 Hyperlipidemia, unspecified: Secondary | ICD-10-CM | POA: Diagnosis not present

## 2022-01-29 DIAGNOSIS — Z6826 Body mass index (BMI) 26.0-26.9, adult: Secondary | ICD-10-CM | POA: Diagnosis not present

## 2022-01-29 DIAGNOSIS — Z23 Encounter for immunization: Secondary | ICD-10-CM | POA: Diagnosis not present

## 2022-01-29 NOTE — Telephone Encounter (Signed)
Pt is on her way to her PCP and was going to get flu vaccine, but would like to make sure this will be okay after having ablation and with her current medications. Please advise.

## 2022-01-29 NOTE — Telephone Encounter (Signed)
Left detailed message informing pt ok to get flu vaccine. Advised to call back if she needed anything further.

## 2022-02-15 DIAGNOSIS — L719 Rosacea, unspecified: Secondary | ICD-10-CM | POA: Diagnosis not present

## 2022-02-21 ENCOUNTER — Telehealth: Payer: Self-pay | Admitting: Cardiology

## 2022-02-21 NOTE — Telephone Encounter (Signed)
Calling in bout having labs done. Please advise

## 2022-02-21 NOTE — Telephone Encounter (Signed)
Pt asking if she can stop by the [facility] office tomorrow for blood work. Advised she can, and she does NOT need to be fasting. Patient verbalized understanding and agreeable to plan.   She also reports she had recent blood work drawn at her PCPs, ast/alt etc.

## 2022-02-22 DIAGNOSIS — Z01812 Encounter for preprocedural laboratory examination: Secondary | ICD-10-CM | POA: Diagnosis not present

## 2022-02-22 DIAGNOSIS — I4819 Other persistent atrial fibrillation: Secondary | ICD-10-CM | POA: Diagnosis not present

## 2022-02-23 LAB — BASIC METABOLIC PANEL
BUN/Creatinine Ratio: 20 (ref 12–28)
BUN: 19 mg/dL (ref 8–27)
CO2: 22 mmol/L (ref 20–29)
Calcium: 10 mg/dL (ref 8.7–10.3)
Chloride: 105 mmol/L (ref 96–106)
Creatinine, Ser: 0.95 mg/dL (ref 0.57–1.00)
Glucose: 95 mg/dL (ref 70–99)
Potassium: 4.5 mmol/L (ref 3.5–5.2)
Sodium: 142 mmol/L (ref 134–144)
eGFR: 62 mL/min/{1.73_m2} (ref >59)

## 2022-02-23 LAB — CBC
Hematocrit: 39.5 % (ref 34.0–46.6)
Hemoglobin: 13.3 g/dL (ref 11.1–15.9)
MCH: 31.9 pg (ref 26.6–33.0)
MCHC: 33.7 g/dL (ref 31.5–35.7)
MCV: 95 fL (ref 79–97)
Platelets: 262 10*3/uL (ref 150–450)
RBC: 4.17 x10E6/uL (ref 3.77–5.28)
RDW: 11.9 % (ref 11.7–15.4)
WBC: 6.7 10*3/uL (ref 3.4–10.8)

## 2022-02-27 DIAGNOSIS — I129 Hypertensive chronic kidney disease with stage 1 through stage 4 chronic kidney disease, or unspecified chronic kidney disease: Secondary | ICD-10-CM | POA: Diagnosis not present

## 2022-02-27 DIAGNOSIS — N179 Acute kidney failure, unspecified: Secondary | ICD-10-CM | POA: Diagnosis not present

## 2022-02-28 ENCOUNTER — Telehealth (HOSPITAL_COMMUNITY): Payer: Self-pay | Admitting: *Deleted

## 2022-02-28 NOTE — Telephone Encounter (Signed)
Reaching out to patient to offer assistance regarding upcoming cardiac imaging study; pt verbalizes understanding of appt date/time, parking situation and where to check in, and verified current allergies; name and call back number provided for further questions should they arise  Wm Fruchter RN Navigator Cardiac Imaging [facility] [phone] office [phone] cell  

## 2022-03-01 ENCOUNTER — Ambulatory Visit (HOSPITAL_BASED_OUTPATIENT_CLINIC_OR_DEPARTMENT_OTHER)
Admission: RE | Admit: 2022-03-01 | Discharge: 2022-03-01 | Disposition: A | Discharge status: Home / Self Care | Payer: Medicare Other | Source: Ambulatory Visit | Attending: Cardiology | Admitting: Cardiology

## 2022-03-01 DIAGNOSIS — I4819 Other persistent atrial fibrillation: Secondary | ICD-10-CM

## 2022-03-01 MED ORDER — IOHEXOL 350 MG/ML SOLN
100.0000 mL | Freq: Once | INTRAVENOUS | Status: AC | PRN
  Administered 2022-03-01: 80 mL via INTRAVENOUS

## 2022-03-06 ENCOUNTER — Telehealth: Payer: Self-pay | Admitting: Cardiology

## 2022-03-06 NOTE — Telephone Encounter (Signed)
Pt c/o medication issue:  1. Name of Medication: all medications  2. How are you currently taking this medication (dosage and times per day)? Needs to know if she takes her meds prior to procedure  3. Are you having a reaction (difficulty breathing--STAT)? no  4. What is your medication issue? Patient calling to find out whether she is supposed to take her medications the night before her procedure. She says the instructions say to not take them the day of her procedure and to take her eliquis the night before. Please advise.

## 2022-03-06 NOTE — Telephone Encounter (Signed)
Clarified medication instructions. Patient verbalized understanding and agreeable to plan.

## 2022-03-07 NOTE — Pre-Procedure Instructions (Signed)
Instructed patient on the following items: Arrival time 1100 Nothing to eat or drink after midnight No meds AM of procedure Responsible person to drive you home and stay with you for 24 hrs  Have you missed any doses of anti-coagulant Eliquis- hasn't missed any doses    

## 2022-03-08 ENCOUNTER — Ambulatory Visit (HOSPITAL_COMMUNITY): Payer: Medicare Other | Admitting: Anesthesiology

## 2022-03-08 ENCOUNTER — Ambulatory Visit (HOSPITAL_BASED_OUTPATIENT_CLINIC_OR_DEPARTMENT_OTHER): Payer: Medicare Other | Admitting: Anesthesiology

## 2022-03-08 ENCOUNTER — Encounter (HOSPITAL_COMMUNITY)
Admission: RE | Disposition: A | Discharge status: Home / Self Care | Payer: Self-pay | Source: Ambulatory Visit | Attending: Cardiology

## 2022-03-08 ENCOUNTER — Ambulatory Visit (HOSPITAL_COMMUNITY)
Admission: RE | Admit: 2022-03-08 | Discharge: 2022-03-08 | Disposition: A | Discharge status: Home / Self Care | Payer: Medicare Other | Source: Ambulatory Visit | Attending: Cardiology | Admitting: Cardiology

## 2022-03-08 DIAGNOSIS — Z79899 Other long term (current) drug therapy: Secondary | ICD-10-CM | POA: Diagnosis not present

## 2022-03-08 DIAGNOSIS — I480 Paroxysmal atrial fibrillation: Secondary | ICD-10-CM | POA: Insufficient documentation

## 2022-03-08 DIAGNOSIS — I4891 Unspecified atrial fibrillation: Secondary | ICD-10-CM | POA: Diagnosis not present

## 2022-03-08 DIAGNOSIS — I10 Essential (primary) hypertension: Secondary | ICD-10-CM | POA: Diagnosis not present

## 2022-03-08 DIAGNOSIS — E785 Hyperlipidemia, unspecified: Secondary | ICD-10-CM | POA: Diagnosis not present

## 2022-03-08 DIAGNOSIS — M1990 Unspecified osteoarthritis, unspecified site: Secondary | ICD-10-CM | POA: Diagnosis not present

## 2022-03-08 HISTORY — PX: ATRIAL FIBRILLATION ABLATION: EP1191

## 2022-03-08 LAB — POCT ACTIVATED CLOTTING TIME: Activated Clotting Time: 353 seconds

## 2022-03-08 SURGERY — ATRIAL FIBRILLATION ABLATION
Anesthesia: General

## 2022-03-08 MED ORDER — ACETAMINOPHEN 325 MG PO TABS: 650.0000 mg | ORAL_TABLET | ORAL | Status: DC | PRN

## 2022-03-08 MED ORDER — HEPARIN (PORCINE) IN NACL 1000-0.9 UT/500ML-% IV SOLN
INTRAVENOUS | Status: DC | PRN
  Administered 2022-03-08 (×4): 500 mL

## 2022-03-08 MED ORDER — HEPARIN SODIUM (PORCINE) 1000 UNIT/ML IJ SOLN
INTRAMUSCULAR | Status: AC
  Filled 2022-03-08: qty 10

## 2022-03-08 MED ORDER — HEPARIN SODIUM (PORCINE) 1000 UNIT/ML IJ SOLN
INTRAMUSCULAR | Status: DC | PRN
  Administered 2022-03-08: 13000 [IU] via INTRAVENOUS
  Administered 2022-03-08: 1000 [IU] via INTRAVENOUS

## 2022-03-08 MED ORDER — SODIUM CHLORIDE 0.9 % IV SOLN: INTRAVENOUS | Status: DC

## 2022-03-08 MED ORDER — LIDOCAINE 2% (20 MG/ML) 5 ML SYRINGE
INTRAMUSCULAR | Status: DC | PRN
  Administered 2022-03-08: 100 mg via INTRAVENOUS

## 2022-03-08 MED ORDER — ONDANSETRON HCL 4 MG/2ML IJ SOLN: 4.0000 mg | Freq: Four times a day (QID) | INTRAMUSCULAR | Status: DC | PRN

## 2022-03-08 MED ORDER — DEXAMETHASONE SODIUM PHOSPHATE 10 MG/ML IJ SOLN
INTRAMUSCULAR | Status: DC | PRN
  Administered 2022-03-08: 4 mg via INTRAVENOUS

## 2022-03-08 MED ORDER — HEPARIN SODIUM (PORCINE) 1000 UNIT/ML IJ SOLN
INTRAMUSCULAR | Status: DC | PRN
  Administered 2022-03-08: 1000 [IU] via INTRAVENOUS

## 2022-03-08 MED ORDER — SODIUM CHLORIDE 0.9 % IV SOLN: 250.0000 mL | INTRAVENOUS | Status: DC | PRN

## 2022-03-08 MED ORDER — SODIUM CHLORIDE 0.9% FLUSH: 3.0000 mL | INTRAVENOUS | Status: DC | PRN

## 2022-03-08 MED ORDER — ONDANSETRON HCL 4 MG/2ML IJ SOLN
INTRAMUSCULAR | Status: DC | PRN
  Administered 2022-03-08: 4 mg via INTRAVENOUS

## 2022-03-08 MED ORDER — ROCURONIUM BROMIDE 10 MG/ML (PF) SYRINGE
PREFILLED_SYRINGE | INTRAVENOUS | Status: DC | PRN
  Administered 2022-03-08: 70 mg via INTRAVENOUS
  Administered 2022-03-08: 20 mg via INTRAVENOUS

## 2022-03-08 MED ORDER — PROTAMINE SULFATE 10 MG/ML IV SOLN
INTRAVENOUS | Status: DC | PRN
  Administered 2022-03-08: 40 mg via INTRAVENOUS

## 2022-03-08 MED ORDER — PROPOFOL 10 MG/ML IV BOLUS
INTRAVENOUS | Status: DC | PRN
  Administered 2022-03-08: 150 mg via INTRAVENOUS

## 2022-03-08 MED ORDER — SUGAMMADEX SODIUM 200 MG/2ML IV SOLN
INTRAVENOUS | Status: DC | PRN
  Administered 2022-03-08: 400 mg via INTRAVENOUS

## 2022-03-08 MED ORDER — PHENYLEPHRINE 80 MCG/ML (10ML) SYRINGE FOR IV PUSH (FOR BLOOD PRESSURE SUPPORT)
PREFILLED_SYRINGE | INTRAVENOUS | Status: DC | PRN
  Administered 2022-03-08 (×2): 40 ug via INTRAVENOUS
  Administered 2022-03-08 (×2): 80 ug via INTRAVENOUS

## 2022-03-08 MED ORDER — FENTANYL CITRATE (PF) 100 MCG/2ML IJ SOLN
INTRAMUSCULAR | Status: DC | PRN
  Administered 2022-03-08: 100 ug via INTRAVENOUS

## 2022-03-08 MED ORDER — SODIUM CHLORIDE 0.9% FLUSH: 3.0000 mL | Freq: Two times a day (BID) | INTRAVENOUS | Status: DC

## 2022-03-08 MED ORDER — MIDAZOLAM HCL 2 MG/2ML IJ SOLN
INTRAMUSCULAR | Status: DC | PRN
  Administered 2022-03-08: 2 mg via INTRAVENOUS

## 2022-03-08 SURGICAL SUPPLY — 18 items
CATH ABLAT QDOT MICRO BI TC DF (CATHETERS) IMPLANT
CATH OCTARAY 2.0 F 3-3-3-3-3 (CATHETERS) IMPLANT
CATH PIGTAIL STEERABLE D1 8.7 (WIRE) IMPLANT
CATH S-M CIRCA TEMP PROBE (CATHETERS) IMPLANT
CATH SOUNDSTAR ECO 8FR (CATHETERS) IMPLANT
CATH WEB BI DIR CSDF CRV REPRO (CATHETERS) IMPLANT
CLOSURE PERCLOSE PROSTYLE (VASCULAR PRODUCTS) IMPLANT
COVER SWIFTLINK CONNECTOR (BAG) ×2 IMPLANT
PACK EP LATEX FREE (CUSTOM PROCEDURE TRAY) ×1
PACK EP LF (CUSTOM PROCEDURE TRAY) ×2 IMPLANT
PAD DEFIB RADIO PHYSIO CONN (PAD) ×2 IMPLANT
PATCH CARTO3 (PAD) IMPLANT
SHEATH CARTO VIZIGO SM CVD (SHEATH) IMPLANT
SHEATH PINNACLE 7F 10CM (SHEATH) IMPLANT
SHEATH PINNACLE 8F 10CM (SHEATH) IMPLANT
SHEATH PINNACLE 9F 10CM (SHEATH) IMPLANT
SHEATH PROBE COVER 6X72 (BAG) IMPLANT
TUBING SMART ABLATE COOLFLOW (TUBING) IMPLANT

## 2022-03-08 NOTE — Anesthesia Preprocedure Evaluation (Addendum)
Anesthesia Evaluation  Patient identified by MRN, date of birth, ID band Patient awake    Reviewed: Allergy & Precautions, H&P , NPO status , Patient's Chart, lab work & pertinent test results  Airway Mallampati: II  TM Distance: >3 FB Neck ROM: Full    Dental no notable dental hx. (+) Teeth Intact, Dental Advisory Given,    Pulmonary neg pulmonary ROS   Pulmonary exam normal breath sounds clear to auscultation       Cardiovascular Exercise Tolerance: Good hypertension, Pt. on medications and Pt. on home beta blockers Normal cardiovascular exam+ dysrhythmias Atrial Fibrillation  Rhythm:Regular Rate:Normal     Neuro/Psych negative neurological ROS  negative psych ROS   GI/Hepatic negative GI ROS, Neg liver ROS,,,  Endo/Other  negative endocrine ROS    Renal/GU negative Renal ROS  negative genitourinary   Musculoskeletal  (+) Arthritis , Osteoarthritis,    Abdominal   Peds negative pediatric ROS (+)  Hematology negative hematology ROS (+)   Anesthesia Other Findings   Reproductive/Obstetrics negative OB ROS                             Anesthesia Physical Anesthesia Plan  ASA: 2  Anesthesia Plan: General   Post-op Pain Management: Minimal or no pain anticipated   Induction: Intravenous  PONV Risk Score and Plan: 3 and Ondansetron, Dexamethasone and Treatment may vary due to age or medical condition  Airway Management Planned: Oral ETT  Additional Equipment: None  Intra-op Plan:   Post-operative Plan: Extubation in OR  Informed Consent: I have reviewed the patients History and Physical, chart, labs and discussed the procedure including the risks, benefits and alternatives for the proposed anesthesia with the patient or authorized representative who has indicated his/her understanding and acceptance.       Plan Discussed with: Anesthesiologist  Anesthesia Plan Comments:  (  )       Anesthesia Quick Evaluation

## 2022-03-08 NOTE — Anesthesia Postprocedure Evaluation (Signed)
Anesthesia Post Note  Patient: Kim Wang  Procedure(s) Performed: ATRIAL FIBRILLATION ABLATION     Patient location during evaluation: Cath Lab Anesthesia Type: General Level of consciousness: awake and alert Pain management: pain level controlled Vital Signs Assessment: post-procedure vital signs reviewed and stable Respiratory status: spontaneous breathing, nonlabored ventilation, respiratory function stable and patient connected to nasal cannula oxygen Cardiovascular status: blood pressure returned to baseline and stable Postop Assessment: no apparent nausea or vomiting Anesthetic complications: no   There were no known notable events for this encounter.  Last Vitals:  Vitals:   03/08/22 1549 03/08/22 1550  BP:  (!) 149/79  Pulse:  63  Resp:  12  Temp: 36.8 C   SpO2:  94%    Last Pain:  Vitals:   03/08/22 1600  TempSrc:   PainSc: 0-No pain                 Collene Schlichter

## 2022-03-08 NOTE — Anesthesia Procedure Notes (Signed)
Procedure Name: Intubation Date/Time: 03/08/2022 1:40 PM  Performed by: Moshe Salisbury, CRNAPre-anesthesia Checklist: Patient identified, Emergency Drugs available, Suction available and Patient being monitored Patient Re-evaluated:Patient Re-evaluated prior to induction Oxygen Delivery Method: Circle System Utilized Preoxygenation: Pre-oxygenation with 100% oxygen Induction Type: IV induction Ventilation: Mask ventilation without difficulty Laryngoscope Size: Mac and 3 Grade View: Grade II Tube type: Oral Number of attempts: 1 Airway Equipment and Method: Stylet Placement Confirmation: ETT inserted through vocal cords under direct vision, positive ETCO2 and breath sounds checked- equal and bilateral Secured at: 21 cm Tube secured with: Tape Dental Injury: Teeth and Oropharynx as per pre-operative assessment

## 2022-03-08 NOTE — Discharge Instructions (Addendum)
Cardiac Ablation, Care After  This sheet gives you information about how to care for yourself after your procedure. Your health care provider may also give you more specific instructions. If you have problems or questions, contact your health care provider. What can I expect after the procedure? After the procedure, it is common to have: Bruising around your puncture site. Tenderness around your puncture site. Skipped heartbeats. If you had an atrial fibrillation ablation, you may have atrial fibrillation during the first several months after your procedure.  Tiredness (fatigue).  Follow these instructions at home: Puncture site care  Follow instructions from your health care provider about how to take care of your puncture site. Make sure you: If present, leave stitches (sutures), skin glue, or adhesive strips in place. These skin closures may need to stay in place for up to 2 weeks. If adhesive strip edges start to loosen and curl up, you may trim the loose edges. Do not remove adhesive strips completely unless your health care provider tells you to do that. If a large square bandage is present, this may be removed 24 hours after surgery.  Check your puncture site every day for signs of infection. Check for: Redness, swelling, or pain. Fluid or blood. If your puncture site starts to bleed, lie down on your back, apply firm pressure to the area, and contact your health care provider. Warmth. Pus or a bad smell. A pea or small marble sized lump at the site is normal and can take up to three months to resolve.  Driving Do not drive for at least 4 days after your procedure or however long your health care provider recommends. (Do not resume driving if you have previously been instructed not to drive for other health reasons.) Do not drive or use heavy machinery while taking prescription pain medicine. Activity Avoid activities that take a lot of effort for at least 7 days after your  procedure. Do not lift anything that is heavier than 5 lb (4.5 kg) for one week.  No sexual activity for 1 week.  Return to your normal activities as told by your health care provider. Ask your health care provider what activities are safe for you. General instructions Take over-the-counter and prescription medicines only as told by your health care provider. Do not use any products that contain nicotine or tobacco, such as cigarettes and e-cigarettes. If you need help quitting, ask your health care provider. You may shower after 24 hours, but Do not take baths, swim, or use a hot tub for 1 week.  Do not drink alcohol for 24 hours after your procedure. Keep all follow-up visits as told by your health care provider. This is important. Contact a health care provider if: You have redness, mild swelling, or pain around your puncture site. You have fluid or blood coming from your puncture site that stops after applying firm pressure to the area. Your puncture site feels warm to the touch. You have pus or a bad smell coming from your puncture site. You have a fever. You have chest pain or discomfort that spreads to your neck, jaw, or arm. You have chest pain that is worse with lying on your back or taking a deep breath. You are sweating a lot. You feel nauseous. You have a fast or irregular heartbeat. You have shortness of breath. You are dizzy or light-headed and feel the need to lie down. You have pain or numbness in the arm or leg closest to your puncture   site. Get help right away if: Your puncture site suddenly swells. Your puncture site is bleeding and the bleeding does not stop after applying firm pressure to the area. These symptoms may represent a serious problem that is an emergency. Do not wait to see if the symptoms will go away. Get medical help right away. Call your local emergency services (911 in the [place] Do not drive yourself to the hospital. Summary After the procedure, it  is normal to have bruising and tenderness at the puncture site in your groin, neck, or forearm. Check your puncture site every day for signs of infection. Get help right away if your puncture site is bleeding and the bleeding does not stop after applying firm pressure to the area. This is a medical emergency. This information is not intended to replace advice given to you by your health care provider. Make sure you discuss any questions you have with your health care provider.      Femoral Site Care This sheet gives you information about how to care for yourself after your procedure. Your health care provider may also give you more specific instructions. If you have problems or questions, contact your health care provider. What can I expect after the procedure?  After the procedure, it is common to have: Bruising that usually fades within 1-2 weeks. Tenderness at the site. Follow these instructions at home: Wound care Follow instructions from your health care provider about how to take care of your insertion site. Make sure you: Wash your hands with soap and water before you change your bandage (dressing). If soap and water are not available, use hand sanitizer. Remove your dressing as told by your health care provider. In 24 hours Do not take baths, swim, or use a hot tub until your health care provider approves. You may shower 24-48 hours after the procedure or as told by your health care provider. Gently wash the site with plain soap and water. Pat the area dry with a clean towel. Do not rub the site. This may cause bleeding. Do not apply powder or lotion to the site. Keep the site clean and dry. Check your femoral site every day for signs of infection. Check for: Redness, swelling, or pain. Fluid or blood. Warmth. Pus or a bad smell. Activity For the first 2-3 days after your procedure, or as long as directed: Avoid climbing stairs as much as possible. Do not squat. Do not lift  anything that is heavier than 10 lb (4.5 kg), or the limit that you are told, until your health care provider says that it is safe. For 5 days Rest as directed. Avoid sitting for a long time without moving. Get up to take short walks every 1-2 hours. Do not drive for 24 hours if you were given a medicine to help you relax (sedative). General instructions Take over-the-counter and prescription medicines only as told by your health care provider. Keep all follow-up visits as told by your health care provider. This is important. Contact a health care provider if you have: A fever or chills. You have redness, swelling, or pain around your insertion site. Get help right away if: The catheter insertion area swells very fast. You pass out. You suddenly start to sweat or your skin gets clammy. The catheter insertion area is bleeding, and the bleeding does not stop when you hold steady pressure on the area. The area near or just beyond the catheter insertion site becomes pale, cool, tingly, or numb. These   symptoms may represent a serious problem that is an emergency. Do not wait to see if the symptoms will go away. Get medical help right away. Call your local emergency services (911 in the [place] Do not drive yourself to the hospital. Summary After the procedure, it is common to have bruising that usually fades within 1-2 weeks. Check your femoral site every day for signs of infection. Do not lift anything that is heavier than 10 lb (4.5 kg), or the limit that you are told, until your health care provider says that it is safe. This information is not intended to replace advice given to you by your health care provider. Make sure you discuss any questions you have with your health care provider. Document Revised: 04/29/2017 Document Reviewed: 04/29/2017 Elsevier Patient Education  2020 [employer] 

## 2022-03-08 NOTE — Transfer of Care (Signed)
Immediate Anesthesia Transfer of Care Note  Patient: Kim Wang  Procedure(s) Performed: ATRIAL FIBRILLATION ABLATION  Patient Location: Cath Lab  Anesthesia Type:General  Level of Consciousness: drowsy and patient cooperative  Airway & Oxygen Therapy: Patient Spontanous Breathing and Patient connected to nasal cannula oxygen  Post-op Assessment: Report given to RN  Post vital signs: Reviewed and stable  Last Vitals:  Vitals Value Taken Time  BP 148/82 03/08/22 1517  Temp 36.8 C 03/08/22 1518  Pulse 71 03/08/22 1519  Resp 14 03/08/22 1519  SpO2 100 % 03/08/22 1519  Vitals shown include unvalidated device data.  Last Pain:  Vitals:   03/08/22 1518  TempSrc: Temporal  PainSc: Asleep         Complications: There were no known notable events for this encounter.

## 2022-03-08 NOTE — H&P (Signed)
Electrophysiology Office Note   Date:  03/08/2022   ID:  Kim Wang, [place] [birth year], MRN [id number]  PCP:  Abner Greenspan, MD  Cardiologist:  Bing Matter Primary Electrophysiologist:  Amarie Tarte Jorja Loa, MD    Chief Complaint: AF   History of Present Illness: Kim Wang is a 76 y.o. female who is being seen today for the evaluation of AF at the request of No ref. provider found. Presenting today for electrophysiology evaluation.  She has a history significant for atrial fibrillation, hypertension, hyperlipidemia.  She was initially started on flecainide for her atrial fibrillation.  Her flecainide dose had been increased and she was taking 250 mg twice daily as well as metoprolol.  She presented to [facility] with a junctional escape rhythm and heart rates in the 20s.  Metoprolol and flecainide were both stopped and her heart rate improved.  She was discharged.  Since discharge she has had more frequent episodes of atrial fibrillation.  She feels anxious, dizzy, fatigue due to her episodes.  Today, denies symptoms of palpitations, chest pain, shortness of breath, orthopnea, PND, lower extremity edema, claudication, dizziness, presyncope, syncope, bleeding, or neurologic sequela. The patient is tolerating medications without difficulties. Plan ablation today.    Past Medical History:  Diagnosis Date   Hyperlipidemia    on meds   Hypertension    on meds   Osteoporosis    bilateral hips   Past Surgical History:  Procedure Laterality Date   COLONOSCOPY  2012   RG-normal/tics- 10 yr recall   TUMOR REMOVAL Left    LEFT leg lymphoid tumor removed     Current Facility-Administered Medications  Medication Dose Route Frequency Provider Last Rate Last Admin   0.9 %  sodium chloride infusion   Intravenous Continuous Regan Lemming, MD 50 mL/hr at 03/08/22 1103 New Bag at 03/08/22 1103    Allergies:   Tape   Social History:  The patient  reports that she has  never smoked. She has never used smokeless tobacco. She reports current alcohol use of about 7.0 standard drinks of alcohol per week. She reports that she does not use drugs.   Family History:  The patient's family history includes Breast cancer in her mother; Bronchitis in her father; Chronic bronchitis in her father.   ROS:  Please see the history of present illness.   Otherwise, review of systems is positive for none.   All other systems are reviewed and negative.   PHYSICAL EXAM: VS:  BP (!) 150/77   Pulse 67   Temp 97.9 F (36.6 C) (Temporal)   Resp 17   Ht 5\' 4"  (1.626 m)   Wt 68 kg   SpO2 99%   BMI 25.75 kg/m  , BMI Body mass index is 25.75 kg/m. GEN: Well nourished, well developed, in no acute distress  HEENT: normal  Neck: no JVD, carotid bruits, or masses Cardiac: RRR; no murmurs, rubs, or gallops,no edema  Respiratory:  clear to auscultation bilaterally, normal work of breathing GI: soft, nontender, nondistended, + BS MS: no deformity or atrophy  Skin: warm and dry Neuro:  Strength and sensation are intact Psych: euthymic mood, full affect   Recent Labs: 10/13/2021: Magnesium 2.1 11/06/2021: ALT 63 02/22/2022: BUN 19; Creatinine, Ser 0.95; Hemoglobin 13.3; Platelets 262; Potassium 4.5; Sodium 142    Lipid Panel  No results found for: "CHOL", "TRIG", "HDL", "CHOLHDL", "VLDL", "LDLCALC", "LDLDIRECT"   Wt Readings from Last 3 Encounters:  03/08/22 68 kg  12/18/21  64.8 kg  11/20/21 69.1 kg      Other studies Reviewed: Additional studies/ records that were reviewed today include: TTE 09/30/21  Review of the above records today demonstrates:  Mild concentric LVH Ejection fraction 60 to 65% Mildly dilated left atrium  ASSESSMENT AND PLAN:  1.  Paroxysmal atrial fibrillation: Kim Wang has presented today for surgery, with the diagnosis of AF.  The various methods of treatment have been discussed with the patient and family. After consideration of risks,  benefits and other options for treatment, the patient has consented to  Procedure(s): Catheter ablation as a surgical intervention .  Risks include but not limited to complete heart block, stroke, esophageal damage, nerve damage, bleeding, vascular damage, tamponade, perforation, MI, and death. The patient's history has been reviewed, patient examined, no change in status, stable for surgery.  I have reviewed the patient's chart and labs.  Questions were answered to the patient's satisfaction.    Kim Mersch Curt Bears, MD 03/08/2022 12:30 PM

## 2022-03-09 ENCOUNTER — Encounter (HOSPITAL_COMMUNITY): Payer: Self-pay | Admitting: Cardiology

## 2022-03-27 ENCOUNTER — Telehealth: Payer: Self-pay | Admitting: Cardiology

## 2022-03-27 ENCOUNTER — Other Ambulatory Visit: Payer: Self-pay

## 2022-03-27 MED ORDER — MULTAQ 400 MG PO TABS: 400.0000 mg | ORAL_TABLET | Freq: Two times a day (BID) | ORAL | 0 refills | Status: DC

## 2022-03-27 NOTE — Telephone Encounter (Signed)
Called patient and her multaq prescription will not be delivered until this coming weekend or early next week and she only has two pills left. Spoke to Dr. Bing Matter and he was ok with refilling her Multaq for 45 days to cover her until [facility]. Patient was appreciative and had no further questions at this time.

## 2022-03-27 NOTE — Telephone Encounter (Signed)
Patient calling the office for samples of medication:   1.  What medication and dosage are you requesting samples for?   dronedarone (MULTAQ) 400 MG tablet    2.  Are you currently out of this medication? No - Only has 2 doses left

## 2022-03-29 DIAGNOSIS — N179 Acute kidney failure, unspecified: Secondary | ICD-10-CM | POA: Diagnosis not present

## 2022-03-29 DIAGNOSIS — I129 Hypertensive chronic kidney disease with stage 1 through stage 4 chronic kidney disease, or unspecified chronic kidney disease: Secondary | ICD-10-CM | POA: Diagnosis not present

## 2022-04-05 ENCOUNTER — Ambulatory Visit (HOSPITAL_COMMUNITY)
Admission: RE | Admit: 2022-04-05 | Discharge: 2022-04-05 | Disposition: A | Discharge status: Home / Self Care | Payer: Medicare Other | Source: Ambulatory Visit | Attending: Nurse Practitioner | Admitting: Nurse Practitioner

## 2022-04-05 ENCOUNTER — Encounter (HOSPITAL_COMMUNITY): Payer: Self-pay | Admitting: Nurse Practitioner

## 2022-04-05 VITALS — BP 134/80 | HR 67 | Ht 64.0 in | Wt 151.8 lb

## 2022-04-05 DIAGNOSIS — Z7901 Long term (current) use of anticoagulants: Secondary | ICD-10-CM | POA: Diagnosis not present

## 2022-04-05 DIAGNOSIS — I480 Paroxysmal atrial fibrillation: Secondary | ICD-10-CM

## 2022-04-05 DIAGNOSIS — D6869 Other thrombophilia: Secondary | ICD-10-CM

## 2022-04-05 DIAGNOSIS — I10 Essential (primary) hypertension: Secondary | ICD-10-CM | POA: Insufficient documentation

## 2022-04-05 DIAGNOSIS — I4891 Unspecified atrial fibrillation: Secondary | ICD-10-CM | POA: Diagnosis not present

## 2022-04-05 NOTE — Progress Notes (Signed)
Primary Care Physician: Abner Greenspan, MD Referring Physician:Dr. Elberta Fortis Cardiologist: Dr. Perrin Maltese Kim Wang is a 76 y.o. female with a h/o afib, HTN, that is in the afib clinic today one month f/u afib ablation. She states that she has not noted any sustained afib. No swallowing or groin issues. Bask to her usual activities. Compliant with anticoagulation.   Today, she denies symptoms of palpitations, chest pain, shortness of breath, orthopnea, PND, lower extremity edema, dizziness, presyncope, syncope, or neurologic sequela. The patient is tolerating medications without difficulties and is otherwise without complaint today.   Past Medical History:  Diagnosis Date   Hyperlipidemia    on meds   Hypertension    on meds   Osteoporosis    bilateral hips   Past Surgical History:  Procedure Laterality Date   ATRIAL FIBRILLATION ABLATION N/A 03/08/2022   Procedure: ATRIAL FIBRILLATION ABLATION;  Surgeon: Regan Lemming, MD;  Location: [facility] IN[facility];  Service: Cardiovascular;  Laterality: N/A;   COLONOSCOPY  2012   RG-normal/tics- 10 yr recall   TUMOR REMOVAL Left    LEFT leg lymphoid tumor removed    Current Outpatient Medications  Medication Sig Dispense Refill   acetaminophen (TYLENOL) 500 MG tablet Take 500 mg by mouth every 6 (six) hours as needed for moderate pain.     apixaban (ELIQUIS) 5 MG TABS tablet Take 1 tablet (5 mg total) by mouth 2 (two) times daily. 180 tablet 3   Calcium Carbonate-Vitamin D 600-200 MG-UNIT TABS Take 2 tablets by mouth daily.     diltiazem (CARDIZEM CD) 240 MG 24 hr capsule Take 1 capsule (240 mg total) by mouth daily. 90 capsule 3   dronedarone (MULTAQ) 400 MG tablet Take 1 tablet (400 mg total) by mouth 2 (two) times daily with a meal. 45 tablet 0   metoprolol tartrate (LOPRESSOR) 25 MG tablet Take 1 tablet (25 mg total) by mouth 3 (three) times daily. (Patient taking differently: Take 12.5 mg by mouth as needed (elevated HR).)  270 tablet 3   Omega 3 1200 MG CAPS Take 1,200 mg by mouth daily.     Current Facility-Administered Medications  Medication Dose Route Frequency Provider Last Rate Last Admin   technetium tetrofosmin (TC-MYOVIEW) injection 32.5 millicurie  32.5 millicurie Intravenous Once PRN Georgeanna Lea, MD        Allergies  Allergen Reactions   Tape Dermatitis    Social History   Socioeconomic History   Marital status: Married    Spouse name: Not on file   Number of children: Not on file   Years of education: Not on file   Highest education level: Not on file  Occupational History   Not on file  Tobacco Use   Smoking status: Never   Smokeless tobacco: Never  Vaping Use   Vaping Use: Never used  Substance and Sexual Activity   Alcohol use: Yes    Alcohol/week: 7.0 standard drinks of alcohol    Types: 7 Standard drinks or equivalent per week   Drug use: Never   Sexual activity: Yes  Other Topics Concern   Not on file  Social History Narrative   Not on file   Social Determinants of Health   Financial Resource Strain: Not on file  Food Insecurity: Not on file  Transportation Needs: Not on file  Physical Activity: Not on file  Stress: Not on file  Social Connections: Not on file  Intimate Partner Violence: Not on file  Family History  Problem Relation Age of Onset   Breast cancer Mother    Bronchitis Father    Chronic bronchitis Father    Colon polyps Neg Hx    Colon cancer Neg Hx    Esophageal cancer Neg Hx    Prostate cancer Neg Hx    Rectal cancer Neg Hx    Stomach cancer Neg Hx     ROS- All systems are reviewed and negative except as per the HPI above  Physical Exam: Vitals:   04/05/22 1129  BP: 134/80  Pulse: 67  Weight: 68.9 kg  Height: 5\' 4"  (1.626 m)   Wt Readings from Last 3 Encounters:  04/05/22 68.9 kg  03/08/22 68 kg  12/18/21 64.8 kg    Labs: Lab Results  Component Value Date   NA 142 02/22/2022   K 4.5 02/22/2022   CL 105  02/22/2022   CO2 22 02/22/2022   GLUCOSE 95 02/22/2022   BUN 19 02/22/2022   CREATININE 0.95 02/22/2022   CALCIUM 10.0 02/22/2022   MG 2.1 10/13/2021   No results found for: "INR" No results found for: "CHOL", "HDL", "LDLCALC", "TRIG"   GEN- The patient is well appearing, alert and oriented x 3 today.   Head- normocephalic, atraumatic Eyes-  Sclera clear, conjunctiva pink Ears- hearing intact Oropharynx- clear Neck- supple, no JVP Lymph- no cervical lymphadenopathy Lungs- Clear to ausculation bilaterally, normal work of breathing Heart- Regular rate and rhythm, no murmurs, rubs or gallops, PMI not laterally displaced GI- soft, NT, ND, + BS Extremities- no clubbing, cyanosis, or edema MS- no significant deformity or atrophy Skin- no rash or lesion Psych- euthymic mood, full affect Neuro- strength and sensation are intact  EKG-Vent. rate 67 BPM PR interval 154 ms QRS duration 84 ms QT/QTcB 326/344 ms P-R-T axes 76 62 5 Normal sinus rhythm Nonspecific T wave abnormality Abnormal ECG When compared with ECG of 08-Mar-2022 15:20, PREVIOUS ECG IS PRESENT   Assessment and Plan:  1. Afib  S/p afib ablation and is doing well Staying in SR Continue diltiazem 240 mg daily Continue Multaq 400 mg bid   2. CHA2DS2VASc  score of at least 4 Continue eliquis 5 mg bid, reminded not to interrupt anticoagulation for the 3 month  recovery period   F/u with Dr. 10-Mar-2022 05/21/22 and Dr. 05/23/22 01/07/23  03/09/23. [facility] [address] [place], [place] [id number]

## 2022-04-09 DIAGNOSIS — H2513 Age-related nuclear cataract, bilateral: Secondary | ICD-10-CM | POA: Diagnosis not present

## 2022-04-11 DIAGNOSIS — Z0000 Encounter for general adult medical examination without abnormal findings: Secondary | ICD-10-CM | POA: Diagnosis not present

## 2022-04-11 DIAGNOSIS — Z1339 Encounter for screening examination for other mental health and behavioral disorders: Secondary | ICD-10-CM | POA: Diagnosis not present

## 2022-04-11 DIAGNOSIS — Z720 Tobacco use: Secondary | ICD-10-CM | POA: Diagnosis not present

## 2022-04-11 DIAGNOSIS — Z136 Encounter for screening for cardiovascular disorders: Secondary | ICD-10-CM | POA: Diagnosis not present

## 2022-04-11 DIAGNOSIS — Z139 Encounter for screening, unspecified: Secondary | ICD-10-CM | POA: Diagnosis not present

## 2022-04-11 DIAGNOSIS — Z1331 Encounter for screening for depression: Secondary | ICD-10-CM | POA: Diagnosis not present

## 2022-04-25 ENCOUNTER — Telehealth: Payer: Self-pay | Admitting: Cardiology

## 2022-04-25 NOTE — Telephone Encounter (Signed)
Patient has follow-up question regarding her ablation and would like a call back to discuss.

## 2022-04-25 NOTE — Telephone Encounter (Signed)
Educated pt on post-ablation healing process related to breakthrough afib. She will use PRN metoprolol 1/2 tab BID for elevated rates. She will call back on Friday if still in AF for assessment next week. Pt in agreement.

## 2022-04-25 NOTE — Telephone Encounter (Signed)
Spoke with patient who had At Fib ablation on 11/9.  She reports everything has been fine since until 12/21 she started valtrex for fever blisters and since then her heart rate "has been all over the place"  Her KardiaMobile demonstrates At Fib with HR from 96 to 125 bpm.  She has been taking her medications as listed including Eliquis and has used Metoprolol Tartrate 25 mg 1/2 tablet as needed X 2.  Advised to continue taking current medication and using prn Metoprolol for HR over 100 bpm.  Advised I will forward to the At Fib clinic for further review and evaluation.  She is aware she will be contacted with further instructions.

## 2022-04-28 DIAGNOSIS — I129 Hypertensive chronic kidney disease with stage 1 through stage 4 chronic kidney disease, or unspecified chronic kidney disease: Secondary | ICD-10-CM | POA: Diagnosis not present

## 2022-04-28 DIAGNOSIS — N179 Acute kidney failure, unspecified: Secondary | ICD-10-CM | POA: Diagnosis not present

## 2022-05-21 ENCOUNTER — Telehealth: Payer: Self-pay | Admitting: Cardiology

## 2022-05-21 ENCOUNTER — Ambulatory Visit: Payer: Medicare Other | Attending: Cardiology | Admitting: Cardiology

## 2022-05-21 ENCOUNTER — Encounter: Payer: Self-pay | Admitting: Cardiology

## 2022-05-21 VITALS — BP 120/88 | HR 68 | Ht 64.0 in | Wt 154.0 lb

## 2022-05-21 DIAGNOSIS — I480 Paroxysmal atrial fibrillation: Secondary | ICD-10-CM | POA: Diagnosis not present

## 2022-05-21 DIAGNOSIS — E782 Mixed hyperlipidemia: Secondary | ICD-10-CM

## 2022-05-21 DIAGNOSIS — I10 Essential (primary) hypertension: Secondary | ICD-10-CM | POA: Diagnosis not present

## 2022-05-21 DIAGNOSIS — R0609 Other forms of dyspnea: Secondary | ICD-10-CM | POA: Diagnosis not present

## 2022-05-21 MED ORDER — ACYCLOVIR 5 % EX OINT: 1.0000 | TOPICAL_OINTMENT | CUTANEOUS | 1 refills | Status: AC

## 2022-05-21 NOTE — Addendum Note (Signed)
Addended by: Edwyna Shell I on: 05/21/2022 11:08 AM   Modules accepted: Orders

## 2022-05-21 NOTE — Telephone Encounter (Signed)
Patient needs acyclovir ointment sent to [facility]- not [facility].

## 2022-05-21 NOTE — Progress Notes (Signed)
Cardiology Office Note:    Date:  05/21/2022   ID:  Kim Wang, [place] 1946/03/23, MRN [id number]  PCP:  Marco Collie, MD  Cardiologist:  Jenne Campus, MD    Referring MD: Marco Collie, MD   Chief Complaint  Patient presents with   Follow-up    History of Present Illness:    Kim Wang is a 77 y.o. female    past medical history significant for paroxysmal atrial fibrillation, essential hypertension, hyperlipidemia.  She presented recently to the hospital being in junctional escape rhythm.  Apparently her dose of flecainide has been increased 250 twice daily then she was also put on a high dose of metoprolol and up coming to the hospital with very slow heart rate and low blood pressure.  Dose medication has been withdrawn she recovered.  Also she was noted to have high level of transaminase.  Up to 600.  Lipitor has been withdrawn.  She was gradually put back on AV blocking agent she went back to atrial fibrillation   In March 08, 2022 she did have pulmonary vein isolation for control of her atrial fibrillation.  She did quite well and she is doing quite well however since that time she described 2 episode of atrial fibrillation that she recorded on her Kardia device first episode happened after she got COVID-vaccine, second episode when she developed fever blisters and took some Valtrex for that.  But overall seems to be happy and optimistic.  Denies having any tightness squeezing pressure burning chest describes some atypical short lasting split-second sensation of the chest lasting for short period of time not related to exercise   Past Medical History:  Diagnosis Date   Hyperlipidemia    on meds   Hypertension    on meds   Osteoporosis    bilateral hips    Past Surgical History:  Procedure Laterality Date   ATRIAL FIBRILLATION ABLATION N/A 03/08/2022   Procedure: ATRIAL FIBRILLATION ABLATION;  Surgeon: Constance Haw, MD;  Location: [facility];   Service: Cardiovascular;  Laterality: N/A;   COLONOSCOPY  2012   RG-normal/tics- 10 yr recall   TUMOR REMOVAL Left    LEFT leg lymphoid tumor removed    Current Medications: Current Meds  Medication Sig   metoprolol tartrate (LOPRESSOR) 25 MG tablet Take 12.5 mg by mouth daily as needed (Rapid heart rate).   Current Facility-Administered Medications for the 05/21/22 encounter (Office Visit) with Park Liter, MD  Medication   technetium tetrofosmin (TC-MYOVIEW) injection 29.9 millicurie     Allergies:   Tape and Zinc   Social History   Socioeconomic History   Marital status: Married    Spouse name: Not on file   Number of children: Not on file   Years of education: Not on file   Highest education level: Not on file  Occupational History   Not on file  Tobacco Use   Smoking status: Never   Smokeless tobacco: Never  Vaping Use   Vaping Use: Never used  Substance and Sexual Activity   Alcohol use: Yes    Alcohol/week: 7.0 standard drinks of alcohol    Types: 7 Standard drinks or equivalent per week   Drug use: Never   Sexual activity: Yes  Other Topics Concern   Not on file  Social History Narrative   Not on file   Social Determinants of Health   Financial Resource Strain: Not on file  Food Insecurity: Not on file  Transportation  Needs: Not on file  Physical Activity: Not on file  Stress: Not on file  Social Connections: Not on file     Family History: The patient's family history includes Breast cancer in her mother; Bronchitis in her father; Chronic bronchitis in her father. There is no history of Colon polyps, Colon cancer, Esophageal cancer, Prostate cancer, Rectal cancer, or Stomach cancer. ROS:   Please see the history of present illness.    All 14 point review of systems negative except as described per history of present illness  EKGs/Labs/Other Studies Reviewed:      Recent Labs: 10/13/2021: Magnesium 2.1 11/06/2021: ALT 63 02/22/2022: BUN  19; Creatinine, Ser 0.95; Hemoglobin 13.3; Platelets 262; Potassium 4.5; Sodium 142  Recent Lipid Panel No results found for: "CHOL", "TRIG", "HDL", "CHOLHDL", "VLDL", "LDLCALC", "LDLDIRECT"  Physical Exam:    VS:  BP 120/88 (BP Location: Left Arm, Patient Position: Sitting, Cuff Size: Normal)   Pulse 68   Ht 5\' 4"  (1.626 m)   Wt 154 lb (69.9 kg)   SpO2 98%   BMI 26.43 kg/m     Wt Readings from Last 3 Encounters:  05/21/22 154 lb (69.9 kg)  04/05/22 151 lb 12.8 oz (68.9 kg)  03/08/22 150 lb (68 kg)     GEN:  Well nourished, well developed in no acute distress HEENT: Normal NECK: No JVD; No carotid bruits LYMPHATICS: No lymphadenopathy CARDIAC: RRR, no murmurs, no rubs, no gallops RESPIRATORY:  Clear to auscultation without rales, wheezing or rhonchi  ABDOMEN: Soft, non-tender, non-distended MUSCULOSKELETAL:  No edema; No deformity  SKIN: Warm and dry LOWER EXTREMITIES: no swelling NEUROLOGIC:  Alert and oriented x 3 PSYCHIATRIC:  Normal affect   ASSESSMENT:    1. Paroxysmal atrial fibrillation (HCC)   2. Dyspnea on exertion   3. Mixed hyperlipidemia   4. Primary hypertension    PLAN:    In order of problems listed above:  Paroxysmal atrial fibrillation status post atrial fibrillation ablation still some breakthrough arrhythmia likely short episodes well-tolerated.  I still think we can blame this on recovery.  After atrial fibrillation ablation.  Will continue with Multaq and anticoagulation.  I will see him back in my office in about 3 months.  I asked her to send me recording if she catches some atrial fibrillation.  I told her that within 3 months after procedure still acceptable and then later we talk about potentially what strategy will be however overall frequency of episode of atrial fibrillation decrease in overall tolerance is much better right now. 2. Patient on exertion doing well from that point review 3.  Mixed dyslipidemia the typically PN LDL is 161 HDL 73  obviously very high.  I wanted her to start statin she does not want yet.  She said she want to wait until next visit before she accepted offer to take that medication   Medication Adjustments/Labs and Tests Ordered: Current medicines are reviewed at length with the patient today.  Concerns regarding medicines are outlined above.  No orders of the defined types were placed in this encounter.  Medication changes: No orders of the defined types were placed in this encounter.   Signed, Park Liter, MD, [facility] 05/21/2022 10:55 AM    [place]

## 2022-05-21 NOTE — Patient Instructions (Addendum)
Medication Instructions:  Your physician has recommended you make the following change in your medication:   START: Acyclovir ointment 1 application topically every 3 hours.  *If you need a refill on your cardiac medications before your next appointment, please call your pharmacy*   Lab Work: None If you have labs (blood work) drawn today and your tests are completely normal, you will receive your results only by: [facility] (if you have MyChart) OR A paper copy in the mail If you have any lab test that is abnormal or we need to change your treatment, we will call you to review the results.   Testing/Procedures: None   Follow-Up: At [facility], you and your health needs are our priority.  As part of our continuing mission to provide you with exceptional heart care, we have created designated Provider Care Teams.  These Care Teams include your primary Cardiologist (physician) and Advanced Practice Providers (APPs -  Physician Assistants and Nurse Practitioners) who all work together to provide you with the care you need, when you need it.  We recommend signing up for the patient portal called "MyChart".  Sign up information is provided on this After Visit Summary.  MyChart is used to connect with patients for Virtual Visits (Telemedicine).  Patients are able to view lab/test results, encounter notes, upcoming appointments, etc.  Non-urgent messages can be sent to your provider as well.   To learn more about what you can do with MyChart, go to [email or web address].    Your next appointment:   3 month(s)  Provider:   Jenne Campus, MD    Other Instructions [email or web address]

## 2022-05-29 DIAGNOSIS — I129 Hypertensive chronic kidney disease with stage 1 through stage 4 chronic kidney disease, or unspecified chronic kidney disease: Secondary | ICD-10-CM | POA: Diagnosis not present

## 2022-05-29 DIAGNOSIS — N179 Acute kidney failure, unspecified: Secondary | ICD-10-CM | POA: Diagnosis not present

## 2022-06-07 ENCOUNTER — Encounter (HOSPITAL_COMMUNITY): Payer: Self-pay | Admitting: *Deleted

## 2022-06-08 ENCOUNTER — Ambulatory Visit: Payer: Medicare Other | Attending: Cardiology | Admitting: Cardiology

## 2022-06-08 ENCOUNTER — Encounter: Payer: Self-pay | Admitting: Cardiology

## 2022-06-08 VITALS — BP 140/80 | HR 69 | Ht 64.0 in | Wt 154.0 lb

## 2022-06-08 DIAGNOSIS — I480 Paroxysmal atrial fibrillation: Secondary | ICD-10-CM | POA: Insufficient documentation

## 2022-06-08 DIAGNOSIS — D6869 Other thrombophilia: Secondary | ICD-10-CM | POA: Diagnosis not present

## 2022-06-08 NOTE — Progress Notes (Signed)
Electrophysiology Office Note   Date:  06/08/2022   ID:  Kim, Wang 1946/02/04, MRN [id number]  PCP:  Marco Collie, MD  Cardiologist:  Agustin Cree Primary Electrophysiologist:  Aerabella Galasso Meredith Leeds, MD    Chief Complaint: AF   History of Present Illness: Kim Wang is a 77 y.o. female who is being seen today for the evaluation of AF at the request of Marco Collie, MD. Presenting today for electrophysiology evaluation.  She has a history significant for atrial fibrillation, hypertension, hyperlipidemia.  She was started on flecainide for atrial fibrillation.  Flecainide dose was increased and she was taken to 50 mg twice daily as well as metoprolol.  She presented [facility] with a junctional escape rhythm and heart rates in the 20s.  Metoprolol and flecainide were both stopped and her heart rate improved.  She was discharged and had more episodes of atrial fibrillation.  She is now post ablation 03/08/2022.  Post ablation she had a few episodes of atrial fibrillation.  She had a COVID-19 vaccination and had an episode of atrial fibrillation after that.  She also has to take Valtrex for cold sores and had 1 episode after that.  She has not had any episodes of atrial fibrillation since the middle of December.  Today, denies symptoms of palpitations, chest pain, shortness of breath, orthopnea, PND, lower extremity edema, claudication, dizziness, presyncope, syncope, bleeding, or neurologic sequela. The patient is tolerating medications without difficulties.     Past Medical History:  Diagnosis Date   Hyperlipidemia    on meds   Hypertension    on meds   Osteoporosis    bilateral hips   Past Surgical History:  Procedure Laterality Date   ATRIAL FIBRILLATION ABLATION N/A 03/08/2022   Procedure: ATRIAL FIBRILLATION ABLATION;  Surgeon: Constance Haw, MD;  Location: [facility];  Service: Cardiovascular;  Laterality: N/A;   COLONOSCOPY  2012    RG-normal/tics- 10 yr recall   TUMOR REMOVAL Left    LEFT leg lymphoid tumor removed     Current Outpatient Medications  Medication Sig Dispense Refill   acetaminophen (TYLENOL) 500 MG tablet Take 500 mg by mouth every 6 (six) hours as needed for moderate pain.     acyclovir ointment (ZOVIRAX) 5 % Apply 1 Application topically every 3 (three) hours. 15 g 1   apixaban (ELIQUIS) 5 MG TABS tablet Take 1 tablet (5 mg total) by mouth 2 (two) times daily. 180 tablet 3   Calcium Carbonate-Vitamin D 600-200 MG-UNIT TABS Take 2 tablets by mouth daily.     diltiazem (CARDIZEM CD) 240 MG 24 hr capsule Take 1 capsule (240 mg total) by mouth daily. 90 capsule 3   metoprolol tartrate (LOPRESSOR) 25 MG tablet Take 12.5 mg by mouth daily as needed (Rapid heart rate).     Omega 3 1200 MG CAPS Take 1,200 mg by mouth daily.     Current Facility-Administered Medications  Medication Dose Route Frequency Provider Last Rate Last Admin   technetium tetrofosmin (TC-MYOVIEW) injection [id number] millicurie  [id number] millicurie Intravenous Once PRN Park Liter, MD        Allergies:   Tape and Zinc   Social History:  The patient  reports that she has never smoked. She has never used smokeless tobacco. She reports current alcohol use of about 7.0 standard drinks of alcohol per week. She reports that she does not use drugs.   Family History:  The patient's family history includes Breast cancer  in her mother; Bronchitis in her father; Chronic bronchitis in her father.   ROS:  Please see the history of present illness.   Otherwise, review of systems is positive for none.   All other systems are reviewed and negative.   PHYSICAL EXAM: VS:  BP (!) 140/80   Pulse 69   Ht 5' 4"$  (1.626 m)   Wt 154 lb (69.9 kg)   SpO2 98%   BMI 26.43 kg/m  , BMI Body mass index is 26.43 kg/m. GEN: Well nourished, well developed, in no acute distress  HEENT: normal  Neck: no JVD, carotid bruits, or masses Cardiac: RRR; no murmurs,  rubs, or gallops,no edema  Respiratory:  clear to auscultation bilaterally, normal work of breathing GI: soft, nontender, nondistended, + BS MS: no deformity or atrophy  Skin: warm and dry Neuro:  Strength and sensation are intact Psych: euthymic mood, full affect  EKG:  EKG is ordered today. Personal review of the ekg ordered shows sinus rhythm   Recent Labs: 10/13/2021: Magnesium 2.1 11/06/2021: ALT 63 02/22/2022: BUN 19; Creatinine, Ser 0.95; Hemoglobin 13.3; Platelets 262; Potassium 4.5; Sodium 142    Lipid Panel  No results found for: "CHOL", "TRIG", "HDL", "CHOLHDL", "VLDL", "LDLCALC", "LDLDIRECT"   Wt Readings from Last 3 Encounters:  06/08/22 154 lb (69.9 kg)  05/21/22 154 lb (69.9 kg)  04/05/22 151 lb 12.8 oz (68.9 kg)      Other studies Reviewed: Additional studies/ records that were reviewed today include: TTE 09/30/21  Review of the above records today demonstrates:  Mild concentric LVH Ejection fraction 60 to 65% Mildly dilated left atrium  ASSESSMENT AND PLAN:  1.  Paroxysmal atrial fibrillation: Currently on diltiazem, Multaq, Eliquis, doses above.  CHA2DS2-VASc of 3.  She is status post ablation 03/08/2022.  She did have a few episodes of atrial fibrillation after ablation such as after a COVID vaccination or having to take Valtrex.  She has not had any episodes over the last month and a half.  Juandavid Dallman plan to stop Multaq today.  2.  Hypertension: Currently well-controlled  3.  Secondary to coagula state: Currently on Eliquis for atrial fibrillation as above   Current medicines are reviewed at length with the patient today.   The patient does not have concerns regarding her medicines.  The following changes were made today: Stop Multaq  Labs/ tests ordered today include:  Orders Placed This Encounter  Procedures   EKG 12-Lead     Disposition:   FU 6 months  Signed, Leldon Steege Meredith Leeds, MD  06/08/2022 12:35 PM     [facility] [address] [place] [address] [phone] (office) [phone] (fax)

## 2022-06-08 NOTE — Patient Instructions (Signed)
Medication Instructions:  Your physician has recommended you make the following change in your medication: STOP Multaq  *If you need a refill on your cardiac medications before your next appointment, please call your pharmacy*   Lab Work: None ordered  Testing/Procedures: None ordered   Follow-Up: At [facility], you and your health needs are our priority.  As part of our continuing mission to provide you with exceptional heart care, we have created designated Provider Care Teams.  These Care Teams include your primary Cardiologist (physician) and Advanced Practice Providers (APPs -  Physician Assistants and Nurse Practitioners) who all work together to provide you with the care you need, when you need it.  Your next appointment:   6 month(s)  The format for your next appointment:   In Person  Provider:   Allegra Lai, MD    Thank you for choosing [facility]!!   Trinidad Curet, RN [phone]  Other Instructions

## 2022-06-28 DIAGNOSIS — I129 Hypertensive chronic kidney disease with stage 1 through stage 4 chronic kidney disease, or unspecified chronic kidney disease: Secondary | ICD-10-CM | POA: Diagnosis not present

## 2022-06-28 DIAGNOSIS — N179 Acute kidney failure, unspecified: Secondary | ICD-10-CM | POA: Diagnosis not present

## 2022-07-02 ENCOUNTER — Encounter (HOSPITAL_COMMUNITY): Payer: Self-pay

## 2022-07-02 IMAGING — MG MM DIGITAL SCREENING BILAT W/ TOMO AND CAD
8 series · 8 of 24 positions shown · non-contrast
Comparison: Previous exam(s).

CLINICAL DATA: Screening.

EXAM:
DIGITAL SCREENING BILATERAL MAMMOGRAM WITH TOMOSYNTHESIS AND CAD
TECHNIQUE: Bilateral screening digital craniocaudal and mediolateral oblique
mammograms were obtained. Bilateral screening digital breast
tomosynthesis was performed. The images were evaluated with
computer-aided detection.

[R MLO synth-2D]
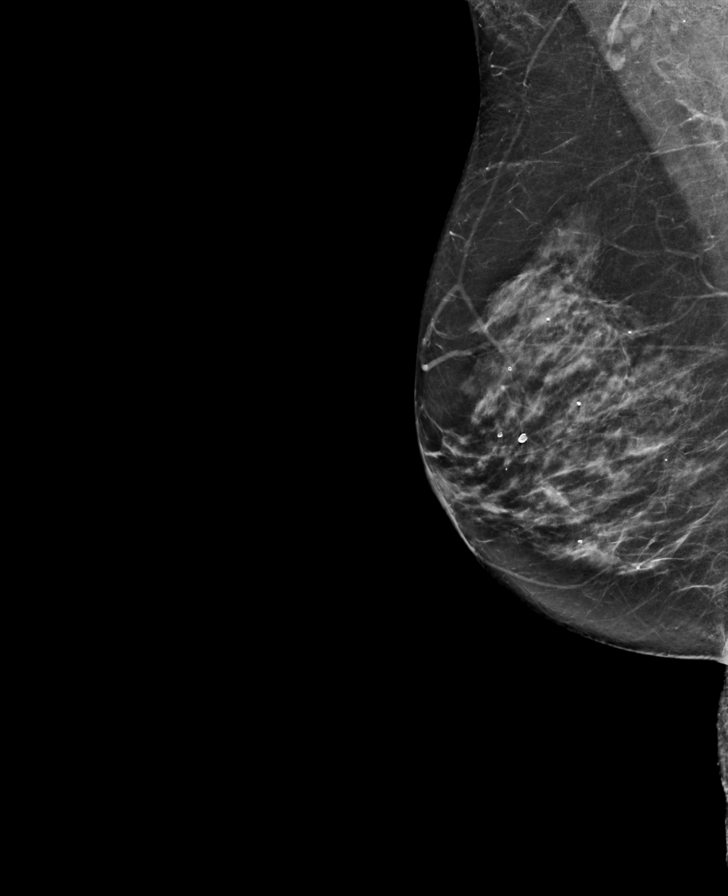

[L MLO synth-2D]
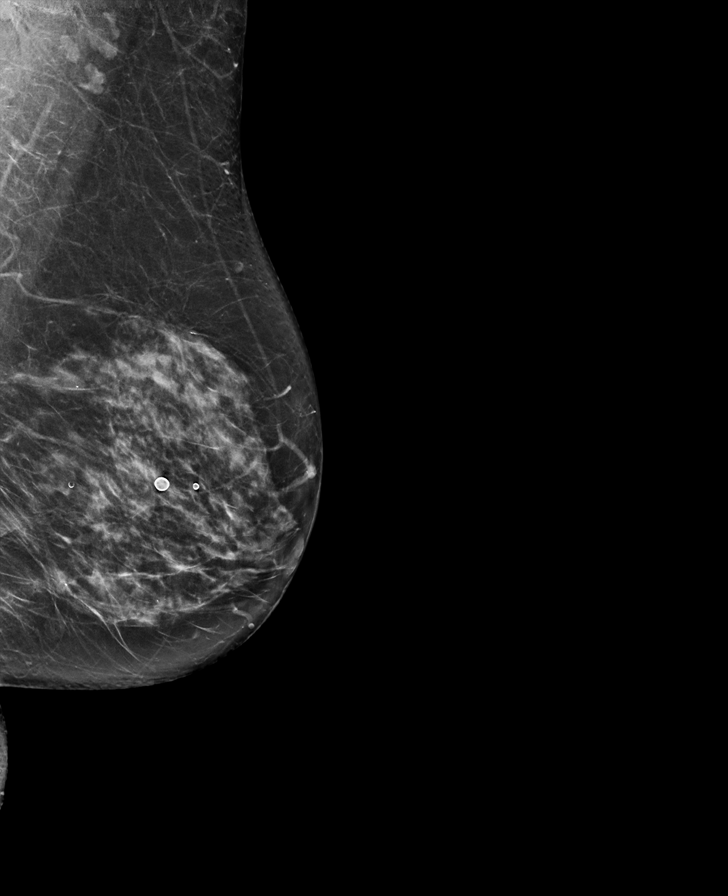

[L CC synth-2D]
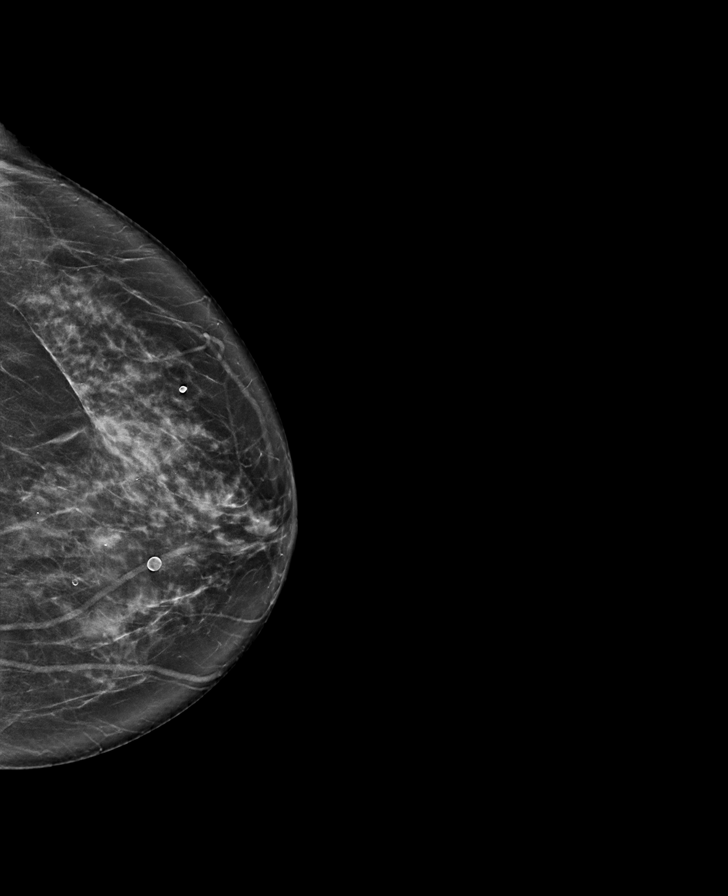

[R CC synth-2D]
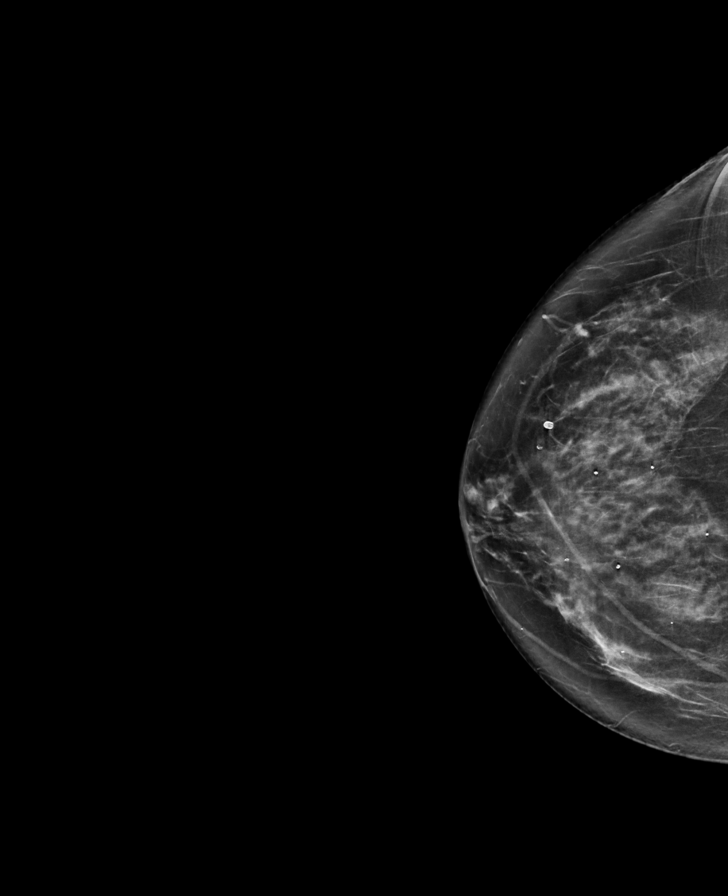

[L MLO tomo · tomo slice 37/74.0]
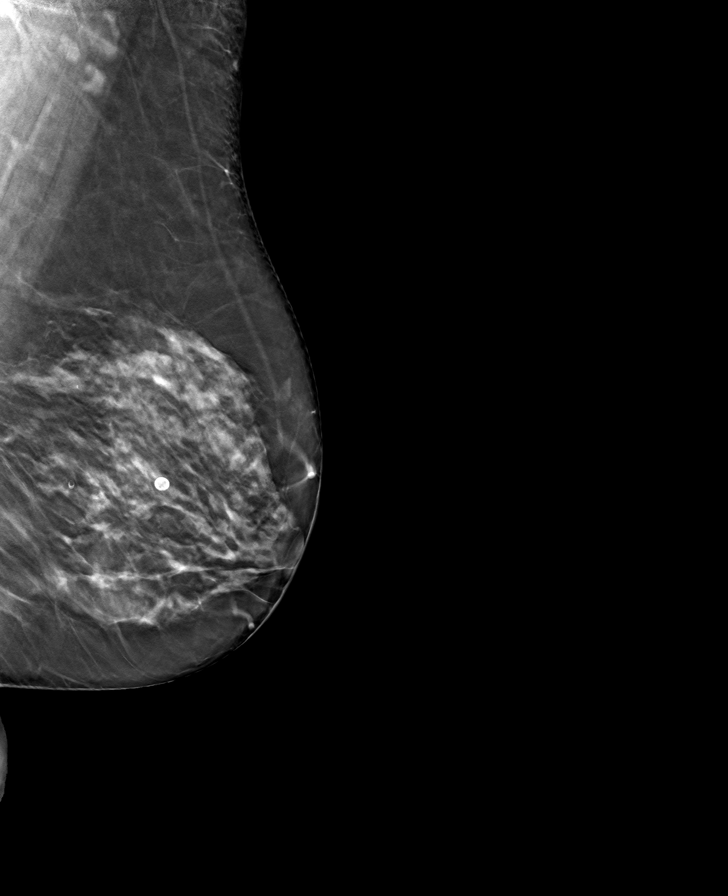

[R CC tomo · tomo slice 37/72.0]
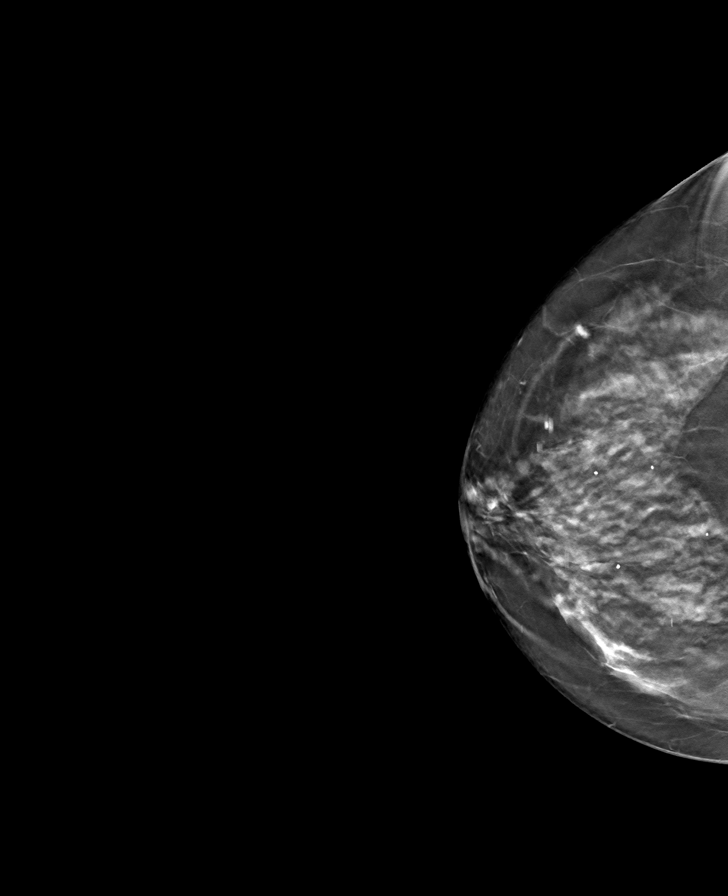

[L CC tomo · tomo slice 37/74.0]
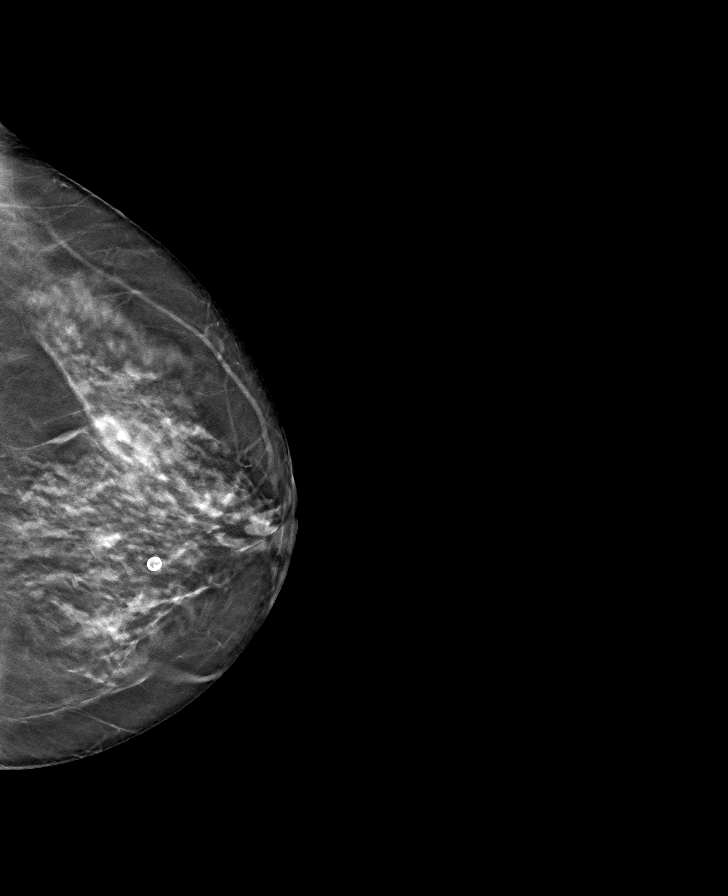

[R MLO tomo · tomo slice 36/71.0]
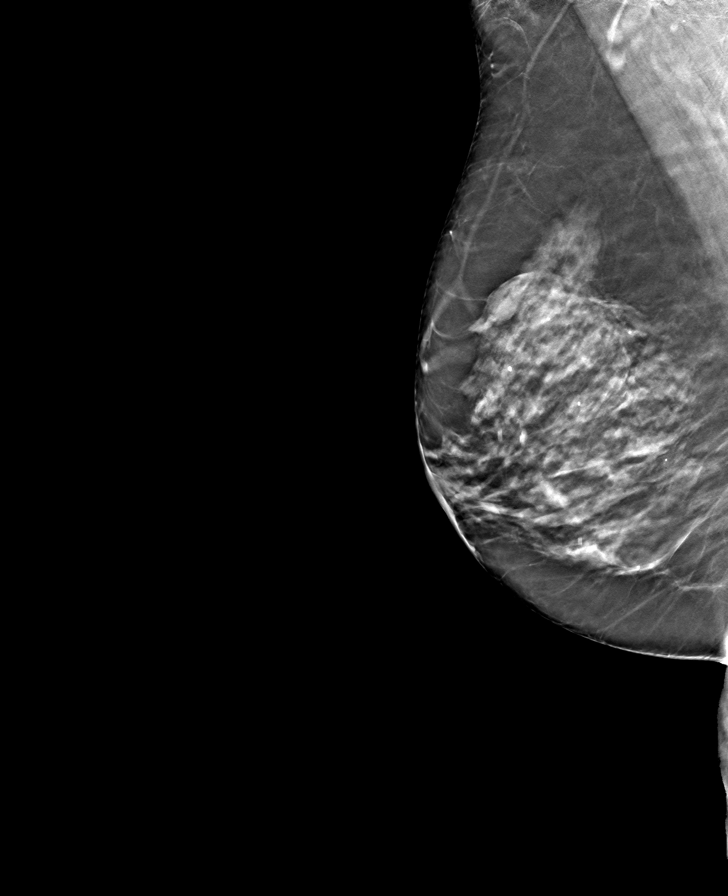

[8 of 24 positions shown; findings below may reference images not displayed]

ACR Breast Density Category c: The breast tissue is heterogeneously
dense, which may obscure small masses.
FINDINGS: There are no findings suspicious for malignancy.
IMPRESSION: No mammographic evidence of malignancy. A result letter of this
screening mammogram will be mailed directly to the patient.

RECOMMENDATION:
Screening mammogram in one year. (Code:Q3-W-BC3)

BI-RADS CATEGORY  1: Negative.

## 2022-07-03 ENCOUNTER — Telehealth (HOSPITAL_COMMUNITY): Payer: Self-pay | Admitting: *Deleted

## 2022-07-03 NOTE — Telephone Encounter (Signed)
Pt since Friday has noticed elevated heart rates 80-105. Can "feel her heart beating" but otherwise asymptomatic. Kardia strips confirmed by Adline Peals PA that all were NSR. Pt is currently dealing with URI. Pt will continue to monitor her heart rates will more than likely normalize after recovers from cold. If HR continue being elevated or she becomes symptomatic she will call back. Pt in agreement.

## 2022-07-17 ENCOUNTER — Telehealth (HOSPITAL_COMMUNITY): Payer: Self-pay | Admitting: *Deleted

## 2022-07-17 NOTE — Telephone Encounter (Signed)
Pt calling in stating she is having increased episodes of afib since stopping multaq. The last several weeks she has had multiple episodes. Usually converts after PRN use of metoprolol. This morning her HRs were in the 140s returned to 68 after PRN dose of metoprolol. Pt inquiring if she should resume multaq with increase in episodes of afib. Per Adline Peals PA will confirm current rhythm with kardia - if in NSR then we will plan to start Multaq back at 400mg  BID. Will await kardia strips.

## 2022-07-18 MED ORDER — MULTAQ 400 MG PO TABS: 400.0000 mg | ORAL_TABLET | Freq: Two times a day (BID) | ORAL | 2 refills | Status: DC

## 2022-07-18 NOTE — Telephone Encounter (Signed)
Per Tilda Franco patient is back in normal sinus rhythm. HR is 78. Her previous EKG strips did show A-fib. Instructed patient to start back on her Multaq 400mg  BID. She was told she is still healing post ablation. Patient is concerned about having to start back on the Multaq. She was told she may have to be on it for 6 months or longer. Patient was told to discuss with Dr. Curt Bears in August. Sent in her Multaq 400mg  BID, 90 day supply with 2 refills to [facility]. Communicated with patient and she verbalized understanding.

## 2022-07-29 DIAGNOSIS — I129 Hypertensive chronic kidney disease with stage 1 through stage 4 chronic kidney disease, or unspecified chronic kidney disease: Secondary | ICD-10-CM | POA: Diagnosis not present

## 2022-07-29 DIAGNOSIS — N179 Acute kidney failure, unspecified: Secondary | ICD-10-CM | POA: Diagnosis not present

## 2022-08-06 DIAGNOSIS — E785 Hyperlipidemia, unspecified: Secondary | ICD-10-CM | POA: Diagnosis not present

## 2022-08-06 DIAGNOSIS — I129 Hypertensive chronic kidney disease with stage 1 through stage 4 chronic kidney disease, or unspecified chronic kidney disease: Secondary | ICD-10-CM | POA: Diagnosis not present

## 2022-08-13 DIAGNOSIS — N182 Chronic kidney disease, stage 2 (mild): Secondary | ICD-10-CM | POA: Diagnosis not present

## 2022-08-13 DIAGNOSIS — E785 Hyperlipidemia, unspecified: Secondary | ICD-10-CM | POA: Diagnosis not present

## 2022-08-13 DIAGNOSIS — I4891 Unspecified atrial fibrillation: Secondary | ICD-10-CM | POA: Diagnosis not present

## 2022-08-13 DIAGNOSIS — I129 Hypertensive chronic kidney disease with stage 1 through stage 4 chronic kidney disease, or unspecified chronic kidney disease: Secondary | ICD-10-CM | POA: Diagnosis not present

## 2022-08-13 DIAGNOSIS — Z6825 Body mass index (BMI) 25.0-25.9, adult: Secondary | ICD-10-CM | POA: Diagnosis not present

## 2022-08-28 DIAGNOSIS — N179 Acute kidney failure, unspecified: Secondary | ICD-10-CM | POA: Diagnosis not present

## 2022-08-28 DIAGNOSIS — I129 Hypertensive chronic kidney disease with stage 1 through stage 4 chronic kidney disease, or unspecified chronic kidney disease: Secondary | ICD-10-CM | POA: Diagnosis not present

## 2022-08-29 ENCOUNTER — Ambulatory Visit: Payer: Medicare Other | Attending: Cardiology | Admitting: Cardiology

## 2022-08-29 ENCOUNTER — Encounter: Payer: Self-pay | Admitting: Cardiology

## 2022-08-29 VITALS — BP 108/66 | HR 71 | Ht 64.0 in | Wt 157.6 lb

## 2022-08-29 DIAGNOSIS — I10 Essential (primary) hypertension: Secondary | ICD-10-CM

## 2022-08-29 DIAGNOSIS — R0609 Other forms of dyspnea: Secondary | ICD-10-CM

## 2022-08-29 DIAGNOSIS — I480 Paroxysmal atrial fibrillation: Secondary | ICD-10-CM | POA: Diagnosis not present

## 2022-08-29 DIAGNOSIS — E782 Mixed hyperlipidemia: Secondary | ICD-10-CM

## 2022-08-29 MED ORDER — ATORVASTATIN CALCIUM 20 MG PO TABS: 20.0000 mg | ORAL_TABLET | Freq: Every day | ORAL | 3 refills | Status: DC

## 2022-08-29 NOTE — Progress Notes (Signed)
Cardiology Office Note:    Date:  08/29/2022   ID:  Kim Wang, [place] 11/19/1945, MRN [id number]  PCP:  Abner Greenspan, MD  Cardiologist:  Gypsy Balsam, MD    Referring MD: Abner Greenspan, MD   Chief Complaint  Patient presents with   Follow-up    History of Present Illness:    Kim Wang is a 77 y.o. female   past medical history significant for paroxysmal atrial fibrillation, essential hypertension, hyperlipidemia.  She presented recently to the hospital being in junctional escape rhythm.  Apparently her dose of flecainide has been increased 250 twice daily then she was also put on a high dose of metoprolol and up coming to the hospital with very slow heart rate and low blood pressure.  Dose medication has been withdrawn she recovered.  Also she was noted to have high level of transaminase.  Up to 600.  Lipitor has been withdrawn.  She was gradually put back on AV blocking agent she went back to atrial fibrillation   In March 08, 2022 she did have pulmonary vein isolation for control of her atrial fibrillation.  Since that time Multaq has been discontinued and she started having more episodes of atrial fibrillation she was put back on Multaq still have some episodes but not as much but still present.  Denies have any chest pain tightness squeezing pressure burning chest.  She is getting ready to go to cruise to [place] and she is looking forward to it.  Denies have any chest pain tightness squeezing pressure burning chest.  Past Medical History:  Diagnosis Date   Hyperlipidemia    on meds   Hypertension    on meds   Osteoporosis    bilateral hips    Past Surgical History:  Procedure Laterality Date   ATRIAL FIBRILLATION ABLATION N/A 03/08/2022   Procedure: ATRIAL FIBRILLATION ABLATION;  Surgeon: Regan Lemming, MD;  Location: [facility];  Service: Cardiovascular;  Laterality: N/A;   COLONOSCOPY  2012   RG-normal/tics- 10 yr recall   TUMOR REMOVAL Left     LEFT leg lymphoid tumor removed    Current Medications: Current Meds  Medication Sig   acetaminophen (TYLENOL) 500 MG tablet Take 500 mg by mouth every 6 (six) hours as needed for moderate pain.   acyclovir ointment (ZOVIRAX) 5 % Apply 1 Application topically every 3 (three) hours.   apixaban (ELIQUIS) 5 MG TABS tablet Take 1 tablet (5 mg total) by mouth 2 (two) times daily.   Calcium Carbonate-Vitamin D 600-200 MG-UNIT TABS Take 2 tablets by mouth daily.   diltiazem (CARDIZEM CD) 240 MG 24 hr capsule Take 1 capsule (240 mg total) by mouth daily.   dronedarone (MULTAQ) 400 MG tablet Take 1 tablet (400 mg total) by mouth 2 (two) times daily with a meal.   metoprolol tartrate (LOPRESSOR) 25 MG tablet Take 12.5 mg by mouth daily as needed (Rapid heart rate).   Omega 3 1200 MG CAPS Take 1,200 mg by mouth daily.   Current Facility-Administered Medications for the 08/29/22 encounter (Office Visit) with Georgeanna Lea, MD  Medication   technetium tetrofosmin (TC-MYOVIEW) injection 32.5 millicurie     Allergies:   Tape and Zinc   Social History   Socioeconomic History   Marital status: Married    Spouse name: Not on file   Number of children: Not on file   Years of education: Not on file   Highest education level: Not on file  Occupational History   Not on file  Tobacco Use   Smoking status: Never   Smokeless tobacco: Never  Vaping Use   Vaping Use: Never used  Substance and Sexual Activity   Alcohol use: Yes    Alcohol/week: 7.0 standard drinks of alcohol    Types: 7 Standard drinks or equivalent per week   Drug use: Never   Sexual activity: Yes  Other Topics Concern   Not on file  Social History Narrative   Not on file   Social Determinants of Health   Financial Resource Strain: Not on file  Food Insecurity: Not on file  Transportation Needs: Not on file  Physical Activity: Not on file  Stress: Not on file  Social Connections: Not on file     Family  History: The patient's family history includes Breast cancer in her mother; Bronchitis in her father; Chronic bronchitis in her father. There is no history of Colon polyps, Colon cancer, Esophageal cancer, Prostate cancer, Rectal cancer, or Stomach cancer. ROS:   Please see the history of present illness.    All 14 point review of systems negative except as described per history of present illness  EKGs/Labs/Other Studies Reviewed:      Recent Labs: 10/13/2021: Magnesium 2.1 11/06/2021: ALT 63 02/22/2022: BUN 19; Creatinine, Ser 0.95; Hemoglobin 13.3; Platelets 262; Potassium 4.5; Sodium 142  Recent Lipid Panel No results found for: "CHOL", "TRIG", "HDL", "CHOLHDL", "VLDL", "LDLCALC", "LDLDIRECT"  Physical Exam:    VS:  BP 108/66 (BP Location: Left Arm, Patient Position: Sitting)   Pulse 71   Ht 5\' 4"  (1.626 m)   Wt 157 lb 9.6 oz (71.5 kg)   SpO2 94%   BMI 27.05 kg/m     Wt Readings from Last 3 Encounters:  08/29/22 157 lb 9.6 oz (71.5 kg)  06/08/22 154 lb (69.9 kg)  05/21/22 154 lb (69.9 kg)     GEN:  Well nourished, well developed in no acute distress HEENT: Normal NECK: No JVD; No carotid bruits LYMPHATICS: No lymphadenopathy CARDIAC: RRR, no murmurs, no rubs, no gallops RESPIRATORY:  Clear to auscultation without rales, wheezing or rhonchi  ABDOMEN: Soft, non-tender, non-distended MUSCULOSKELETAL:  No edema; No deformity  SKIN: Warm and dry LOWER EXTREMITIES: no swelling NEUROLOGIC:  Alert and oriented x 3 PSYCHIATRIC:  Normal affect   ASSESSMENT:    1. Paroxysmal atrial fibrillation (HCC)   2. Primary hypertension   3. Mixed hyperlipidemia   4. Dyspnea on exertion    PLAN:    In order of problems listed above:  Paroxysmal atrial fibrillation still continue having some recurrences.  She is scheduled to see EP in July.  She is back on Multaq.  Continue anticoagulation. Essential hypertension blood pressure well-controlled continue present  management. Dyslipidemia LDL 167 HDL 75 this is from August 06, 2022, AST ALT were normal.  Therefore I will start her back on Lipitor however we go only with with 20 mg daily first.  AST ALT fasting lipid profile need to be done within the next 6 weeks Dyspnea exertion: Denies having any   Medication Adjustments/Labs and Tests Ordered: Current medicines are reviewed at length with the patient today.  Concerns regarding medicines are outlined above.  No orders of the defined types were placed in this encounter.  Medication changes: No orders of the defined types were placed in this encounter.   Signed, Georgeanna Lea, MD, [facility] 08/29/2022 10:45 AM    [facility]

## 2022-08-29 NOTE — Patient Instructions (Addendum)
Medication Instructions:   TAKE: Lipitor 20mg  daily   Lab Work: Your physician recommends that you return for lab work in: 1 month You need to have labs done when you are fasting.  You can come Monday through Friday 8:30 am to 12:00 pm and 1:15 to 4:30. You do not need to make an appointment as the order has already been placed. The labs you are going to have done are AST, ALT, Lipids.    Testing/Procedures: None Ordered   Follow-Up: At [facility], you and your health needs are our priority.  As part of our continuing mission to provide you with exceptional heart care, we have created designated Provider Care Teams.  These Care Teams include your primary Cardiologist (physician) and Advanced Practice Providers (APPs -  Physician Assistants and Nurse Practitioners) who all work together to provide you with the care you need, when you need it.  We recommend signing up for the patient portal called "MyChart".  Sign up information is provided on this After Visit Summary.  MyChart is used to connect with patients for Virtual Visits (Telemedicine).  Patients are able to view lab/test results, encounter notes, upcoming appointments, etc.  Non-urgent messages can be sent to your provider as well.   To learn more about what you can do with MyChart, go to [email or web address].    Your next appointment:   6 month(s)  The format for your next appointment:   In Person  Provider:   Gypsy Balsam, MD    Other Instructions NA

## 2022-08-29 NOTE — Addendum Note (Signed)
Addended by: Baldo Ash D on: 08/29/2022 11:06 AM   Modules accepted: Orders

## 2022-09-19 ENCOUNTER — Telehealth: Payer: Self-pay | Admitting: Cardiology

## 2022-09-19 NOTE — Telephone Encounter (Signed)
Pt c/o medication issue:  1. Name of Medication:   dronedarone (MULTAQ) 400 MG tablet   2. How are you currently taking this medication (dosage and times per day)?   As prescribed  3. Are you having a reaction (difficulty breathing--STAT)?  No  4. What is your medication issue?   Patient stated she was prescribed this medication after she has her ablation and was told she would be on this medication for 3 months and it is now 7 months later and patient wants to know if she should still continue to take this medication.

## 2022-09-19 NOTE — Telephone Encounter (Signed)
Pt aware that ablation was 02/2022, she followed up in February w/ [facility] and Multaq was stopped at that appt.    She then spoke w/ afib clinic in March about elevated HRs and sent kardia strips for Ricky to review (although I cannot find/see these strips she reports sending).  Ricky advised her to restart Multaq at that time. She states she will do whatever Dr. Elberta Fortis thinks is best.  Aware that I will forward to Dr. Elberta Fortis to see if ok to trial off the Multaq again to see how things go. (She follows up in August) Pt aware it may be next week before I follow  up w/ her on this matter. Patient verbalized understanding and agreeable to plan.

## 2022-09-21 NOTE — Telephone Encounter (Signed)
Pt informed of MD response/recommendation. Pt would like to think about it over the weekend and will let us know next week her decision. She appreciates the call.

## 2022-09-27 ENCOUNTER — Other Ambulatory Visit: Payer: Self-pay | Admitting: Cardiology

## 2022-09-27 NOTE — Telephone Encounter (Signed)
Prescription refill request for Eliquis received. Indication:afib Last office visit:5/24 Scr:0.9 Age: 77 Weight:71.5  kg  Prescription refilled

## 2022-09-28 DIAGNOSIS — I129 Hypertensive chronic kidney disease with stage 1 through stage 4 chronic kidney disease, or unspecified chronic kidney disease: Secondary | ICD-10-CM | POA: Diagnosis not present

## 2022-09-28 DIAGNOSIS — N179 Acute kidney failure, unspecified: Secondary | ICD-10-CM | POA: Diagnosis not present

## 2022-10-03 DIAGNOSIS — E782 Mixed hyperlipidemia: Secondary | ICD-10-CM | POA: Diagnosis not present

## 2022-10-04 LAB — LIPID PANEL
Chol/HDL Ratio: 2.6 ratio (ref 0.0–4.4)
Cholesterol, Total: 182 mg/dL (ref 100–199)
HDL: 71 mg/dL (ref >39)
LDL Chol Calc (NIH): 93 mg/dL (ref 0–99)
Triglycerides: 99 mg/dL (ref 0–149)
VLDL Cholesterol Cal: 18 mg/dL (ref 5–40)

## 2022-10-04 LAB — AST: AST: 17 IU/L (ref 0–40)

## 2022-10-04 LAB — ALT: ALT: 16 IU/L (ref 0–32)

## 2022-10-10 ENCOUNTER — Ambulatory Visit: Payer: Medicare Other | Admitting: Cardiology

## 2022-10-11 ENCOUNTER — Telehealth: Payer: Self-pay

## 2022-10-11 NOTE — Telephone Encounter (Signed)
-----   Message from Georgeanna Lea, MD sent at 10/10/2022  1:00 PM EDT ----- Collect acceptable, continue present management

## 2022-10-11 NOTE — Telephone Encounter (Signed)
Patient notified through my chart.

## 2022-10-12 NOTE — Telephone Encounter (Signed)
Left message to call back  

## 2022-10-15 ENCOUNTER — Telehealth: Payer: Self-pay | Admitting: Cardiology

## 2022-10-15 NOTE — Telephone Encounter (Signed)
  Patient was returning a call to [facility] to let her know that she is still taking the dronedarone (MULTAQ) 400 MG tablet  and she will stay on until next appointment. She had lab work done around first of June for cholesterol and her ALT is good. Can call her to discuss if needed.

## 2022-10-19 DIAGNOSIS — Z1231 Encounter for screening mammogram for malignant neoplasm of breast: Secondary | ICD-10-CM | POA: Diagnosis not present

## 2022-10-24 ENCOUNTER — Telehealth: Payer: Self-pay | Admitting: Cardiology

## 2022-10-24 MED ORDER — DILTIAZEM HCL ER COATED BEADS 240 MG PO CP24: 240.0000 mg | ORAL_CAPSULE | Freq: Every day | ORAL | 3 refills | Status: DC

## 2022-10-24 MED ORDER — DILTIAZEM HCL ER COATED BEADS 240 MG PO CP24: 240.0000 mg | ORAL_CAPSULE | Freq: Every day | ORAL | 0 refills | Status: DC

## 2022-10-24 NOTE — Telephone Encounter (Signed)
*  STAT* If patient is at the pharmacy, call can be transferred to refill team.   1. Which medications need to be refilled? (please list name of each medication and dose if known) diltiazem (CARDIZEM CD) 240 MG 24 hr capsule    2. Which pharmacy/location (including street and city if local pharmacy) is medication to be sent to? [facility] at [place], [place]   3. Do they need a 30 day or 90 day supply? 90 day  Patient only has 2 pills left.

## 2022-10-24 NOTE — Telephone Encounter (Signed)
Pt advised that the RX has been sent for 30 days to local pharmacy. Advised that we do not have samples. Pt verbalized understanding and had no additional questions.

## 2022-10-24 NOTE — Telephone Encounter (Signed)
Patient calling the office for samples of medication:   1.  What medication and dosage are you requesting samples for? diltiazem (CARDIZEM CD) 240 MG 24 hr capsule   2.  Are you currently out of this medication?   Yes

## 2022-10-24 NOTE — Telephone Encounter (Signed)
RX sent

## 2022-12-04 ENCOUNTER — Ambulatory Visit: Payer: Medicare Other | Admitting: Cardiology

## 2022-12-13 ENCOUNTER — Ambulatory Visit: Payer: Medicare Other | Admitting: Cardiology

## 2022-12-13 ENCOUNTER — Encounter: Payer: Self-pay | Admitting: Cardiology

## 2022-12-13 VITALS — BP 118/72 | HR 67 | Ht 64.0 in | Wt 160.2 lb

## 2022-12-13 DIAGNOSIS — I480 Paroxysmal atrial fibrillation: Secondary | ICD-10-CM | POA: Insufficient documentation

## 2022-12-13 DIAGNOSIS — D6869 Other thrombophilia: Secondary | ICD-10-CM | POA: Diagnosis not present

## 2022-12-13 DIAGNOSIS — I10 Essential (primary) hypertension: Secondary | ICD-10-CM | POA: Insufficient documentation

## 2022-12-13 NOTE — Progress Notes (Signed)
Electrophysiology Office Note:   Date:  12/13/2022  ID:  Kim Wang, [place] Dec 29, 1945, MRN [id number]  Primary Cardiologist: Kim Balsam, MD Electrophysiologist: Kim Lemming, MD      History of Present Illness:   Kim Wang is a 77 y.o. female with h/o atrial fibrillation seen today for routine electrophysiology followup.  Since last being seen in our clinic the patient reports doing well.  She has no chest pain or shortness of breath.  She is able to do all of her daily activities without restriction.  She is happy with her overall control..  she denies chest pain, palpitations, dyspnea, PND, orthopnea, nausea, vomiting, dizziness, syncope, edema, weight gain, or early satiety.     She has a history significant for atrial fibrillation, hypertension, hyperlipidemia.  She was started on flecainide for atrial fibrillation.  Flecainide dose was increased and she was taken to 50 mg twice daily as well as metoprolol.  She presented [facility] with a junctional escape rhythm and heart rates in the 20s.  Metoprolol and flecainide were both stopped and her heart rate improved.  She was discharged and had more episodes of atrial fibrillation.  She is now post ablation 03/08/2022.   Post ablation she had a few episodes of atrial fibrillation.  She had a COVID-19 vaccination and had an episode of atrial fibrillation after that.  She also has to take Valtrex for cold sores and had 1 episode after that.  She has not had any episodes of atrial fibrillation since the middle of December.      Review of systems complete and found to be negative unless listed in HPI.   EP Information / Studies Reviewed:    EKG is ordered today. Personal review as below.  EKG Interpretation Date/Time:  Thursday December 13 2022 13:36:06 EDT Ventricular Rate:  67 PR Interval:  162 QRS Duration:  80 QT Interval:  428 QTC Calculation: 452 R Axis:   79  Text Interpretation: Normal sinus rhythm  Normal ECG No significant change since last tracing Confirmed by ,  ([id number]) on 12/13/2022 1:39:35 PM     Risk Assessment/Calculations:    CHA2DS2-VASc Score = 4   This indicates a 4.8% annual risk of stroke. The patient's score is based upon: CHF History: 0 HTN History: 1 Diabetes History: 0 Stroke History: 0 Vascular Disease History: 0 Age Score: 2 Gender Score: 1             Physical Exam:   VS:  BP 118/72   Pulse 67   Ht 5\' 4"  (1.626 m)   Wt 160 lb 3.2 oz (72.7 kg)   SpO2 97%   BMI 27.50 kg/m    Wt Readings from Last 3 Encounters:  12/13/22 160 lb 3.2 oz (72.7 kg)  08/29/22 157 lb 9.6 oz (71.5 kg)  06/08/22 154 lb (69.9 kg)     GEN: Well nourished, well developed in no acute distress NECK: No JVD; No carotid bruits CARDIAC: Regular rate and rhythm, no murmurs, rubs, gallops RESPIRATORY:  Clear to auscultation without rales, wheezing or rhonchi  ABDOMEN: Soft, non-tender, non-distended EXTREMITIES:  No edema; No deformity   ASSESSMENT AND PLAN:    1.  Paroxysmal atrial fibrillation: Currently on diltiazem, Multaq and Eliquis.  Status post ablation 05/08/2021.  She continue to have short episodes of atrial fibrillation.  She is considering repeat ablation.  For now, we  continue with current management.  2.  Hypertension: Currently well-controlled  3.  Secondary  hypercoagulable state: Currently on Eliquis for atrial fibrillation  Follow up with Dr. Elberta Fortis in 6 months  Signed,  Kim Loa, MD

## 2022-12-13 NOTE — Patient Instructions (Signed)
Medication Instructions:  Your physician recommends that you continue on your current medications as directed. Please refer to the Current Medication list given to you today.  *If you need a refill on your cardiac medications before your next appointment, please call your pharmacy*  Lab Work: None ordered today.  Testing/Procedures: None ordered today.  Follow-Up: At [facility], you and your health needs are our priority.  As part of our continuing mission to provide you with exceptional heart care, we have created designated Provider Care Teams.  These Care Teams include your primary Cardiologist (physician) and Advanced Practice Providers (APPs -  Physician Assistants and Nurse Practitioners) who all work together to provide you with the care you need, when you need it.  Your next appointment:   6 month(s)  The format for your next appointment:   In Person  Provider:   You may see Will Jorja Loa, MD or one of the following Advanced Practice Providers on your designated Care Team:   Francis Dowse, [place] [address] [place], [place] Sherie Don, NP Canary Brim, [facility]

## 2022-12-14 ENCOUNTER — Ambulatory Visit: Payer: Medicare Other | Admitting: Cardiology

## 2022-12-31 ENCOUNTER — Telehealth: Payer: Self-pay | Admitting: Home Health

## 2022-12-31 DIAGNOSIS — E785 Hyperlipidemia, unspecified: Secondary | ICD-10-CM | POA: Diagnosis not present

## 2022-12-31 DIAGNOSIS — I499 Cardiac arrhythmia, unspecified: Secondary | ICD-10-CM | POA: Diagnosis not present

## 2022-12-31 DIAGNOSIS — R002 Palpitations: Secondary | ICD-10-CM | POA: Diagnosis not present

## 2022-12-31 DIAGNOSIS — R9431 Abnormal electrocardiogram [ECG] [EKG]: Secondary | ICD-10-CM | POA: Diagnosis not present

## 2022-12-31 DIAGNOSIS — I480 Paroxysmal atrial fibrillation: Secondary | ICD-10-CM | POA: Diagnosis not present

## 2022-12-31 DIAGNOSIS — I4891 Unspecified atrial fibrillation: Secondary | ICD-10-CM | POA: Diagnosis not present

## 2022-12-31 DIAGNOSIS — U071 COVID-19: Secondary | ICD-10-CM | POA: Diagnosis not present

## 2022-12-31 DIAGNOSIS — I10 Essential (primary) hypertension: Secondary | ICD-10-CM | POA: Diagnosis not present

## 2022-12-31 DIAGNOSIS — Z79899 Other long term (current) drug therapy: Secondary | ICD-10-CM | POA: Diagnosis not present

## 2022-12-31 DIAGNOSIS — Z7901 Long term (current) use of anticoagulants: Secondary | ICD-10-CM | POA: Diagnosis not present

## 2022-12-31 LAB — LAB REPORT - SCANNED: EGFR: 60

## 2022-12-31 NOTE — Telephone Encounter (Signed)
Patient called after-hours line, reports she has been diagnosed with COVID-19.  She has been feeling very poor, noted her heart rate has been racing over 150 on cardia machine.  She continued to take her diltiazem and Multaq, took additional metoprolol 12.5mg  at 10 AM and 12.5 mg at 2 PM today, noted her heart rate continue to be elevated at 155.  Given she is feeling overall poor with persistent A-fib RVR, recommended patient go to the nearest ER for in person evaluation, will need EKG and blood work, potentially IV medication for rate control.  Patient has agreed with above plan.

## 2023-01-01 DIAGNOSIS — I480 Paroxysmal atrial fibrillation: Secondary | ICD-10-CM | POA: Diagnosis not present

## 2023-01-01 DIAGNOSIS — Z7901 Long term (current) use of anticoagulants: Secondary | ICD-10-CM | POA: Diagnosis not present

## 2023-01-01 DIAGNOSIS — U071 COVID-19: Secondary | ICD-10-CM | POA: Diagnosis not present

## 2023-01-10 DIAGNOSIS — Z23 Encounter for immunization: Secondary | ICD-10-CM | POA: Diagnosis not present

## 2023-01-10 DIAGNOSIS — E785 Hyperlipidemia, unspecified: Secondary | ICD-10-CM | POA: Diagnosis not present

## 2023-01-14 DIAGNOSIS — Z6827 Body mass index (BMI) 27.0-27.9, adult: Secondary | ICD-10-CM | POA: Diagnosis not present

## 2023-01-14 DIAGNOSIS — I4891 Unspecified atrial fibrillation: Secondary | ICD-10-CM | POA: Diagnosis not present

## 2023-01-14 DIAGNOSIS — E785 Hyperlipidemia, unspecified: Secondary | ICD-10-CM | POA: Diagnosis not present

## 2023-01-14 DIAGNOSIS — N182 Chronic kidney disease, stage 2 (mild): Secondary | ICD-10-CM | POA: Diagnosis not present

## 2023-01-14 DIAGNOSIS — I129 Hypertensive chronic kidney disease with stage 1 through stage 4 chronic kidney disease, or unspecified chronic kidney disease: Secondary | ICD-10-CM | POA: Diagnosis not present

## 2023-01-17 ENCOUNTER — Ambulatory Visit: Payer: Medicare Other | Admitting: Cardiology

## 2023-01-25 ENCOUNTER — Other Ambulatory Visit: Payer: Self-pay

## 2023-01-27 NOTE — Progress Notes (Unsigned)
Cardiology Office Note:  .   Date:  01/28/2023  ID:  Olukemi Clotfelter, [place] 03-Oct-1945, MRN [id number] PCP: Abner Greenspan, MD  [facility] Providers Cardiologist:  Gypsy Balsam, MD Electrophysiologist:  Regan Lemming, MD    History of Present Illness: .   Kim Wang is a 77 y.o. female with a past medical history of atrial fibrillation Multaq and Eliquis, mild MR, mild TR, dyslipidemia.   03/08/2022 s/p ablation 03/01/2022 CT cardiac morphology calcium score of 0 10/27/2021 echo EF 60 to 65%, mild concentric LVH, trace AR without stenosis, mild MR, mild TR  Most recently evaluated by Dr. Elberta Fortis on 12/13/2022, she continued to have episodes of atrial fibrillation, considering repeat ablation however for now she wanted to continue with current medication management.  She presented to [facility] on 12/31/2022 noted to be in atrial fibrillation with RVR, she was also COVID-positive.  She was started on a Cardizem drip, she eventually converted back to sinus rhythm overnight.  She was restarted on her Cardizem to 40 mg daily, Multaq, Eliquis.  Labs on day of discharge - Na 137,  K 3.9,  Cr 0.60,  hgb 14.2, HCT 41.9.  She presents today for follow up after recent ED visit as outlined above. She keeps a journal of her HR and episodes of AF, she has had the one episode that she presented to the ED with. She checks her rhythm most days of the week on her Kardia mobile device. She is vacillating on wether she wants to have a repeat ablation or not. We discussed in detail that often a second ablation in warranted, and ultimately would like to see EP again. She denies chest pain, palpitations, dyspnea, pnd, orthopnea, n, v, dizziness, syncope, edema, weight gain, or early satiety.    ROS: Review of Systems  Cardiovascular:  Positive for palpitations.  All other systems reviewed and are negative.    Studies Reviewed: .            Risk Assessment/Calculations:    CHA2DS2-VASc  Score = 4   This indicates a 4.8% annual risk of stroke. The patient's score is based upon: CHF History: 0 HTN History: 1 Diabetes History: 0 Stroke History: 0 Vascular Disease History: 0 Age Score: 2 Gender Score: 1            Physical Exam:   VS:  BP 118/68   Pulse 68   Ht 5\' 4"  (1.626 m)   Wt 157 lb 12.8 oz (71.6 kg)   SpO2 96%   BMI 27.09 kg/m    Wt Readings from Last 3 Encounters:  01/28/23 157 lb 12.8 oz (71.6 kg)  01/28/23 157 lb 12.8 oz (71.6 kg)  12/13/22 160 lb 3.2 oz (72.7 kg)    GEN: Well nourished, well developed in no acute distress NECK: No JVD; No carotid bruits CARDIAC: RRR, no murmurs, rubs, gallops RESPIRATORY:  Clear to auscultation without rales, wheezing or rhonchi  ABDOMEN: Soft, non-tender, non-distended EXTREMITIES:  No edema; No deformity   ASSESSMENT AND PLAN: .   Atrial fibrillation/hypercoagulable state/high risk medication use-s/p ablation in November 2023, CHA2DS2-VASc score of 4.  Continue diltiazem to 40 mg daily, continue Multaq 400 mg daily, continue Eliquis 5 mg twice daily-no indication for dose reduction--recent hemoglobin was 14, hematocrit 41 during her ED visit in September.  She is still vacillating on whether she wants to have a second ablation or not, she understands the rationale for maintaining her medications but was  hopeful to get off some of her medications after ablation.  Will send her back to EP for further discussion following a follow-up ablation.  Hypertension-blood pressure is well-controlled at 118/68  Dyslipidemia-currently on Lipitor 20 mg daily, monitored by her PCP.       Dispo: Follow-up with EP for discussion surrounding second ablation.  Follow-up with general cardiology in 6 months.  Signed, Flossie Dibble, NP

## 2023-01-28 ENCOUNTER — Encounter: Payer: Self-pay | Admitting: Cardiology

## 2023-01-28 ENCOUNTER — Ambulatory Visit: Payer: Medicare Other | Admitting: Cardiology

## 2023-01-28 ENCOUNTER — Ambulatory Visit: Payer: Medicare Other | Attending: Cardiology | Admitting: Cardiology

## 2023-01-28 VITALS — BP 118/68 | HR 52 | Ht 64.0 in | Wt 157.8 lb

## 2023-01-28 VITALS — BP 118/68 | HR 68 | Ht 64.0 in | Wt 157.8 lb

## 2023-01-28 DIAGNOSIS — I10 Essential (primary) hypertension: Secondary | ICD-10-CM | POA: Diagnosis present

## 2023-01-28 DIAGNOSIS — N179 Acute kidney failure, unspecified: Secondary | ICD-10-CM | POA: Diagnosis not present

## 2023-01-28 DIAGNOSIS — Z79899 Other long term (current) drug therapy: Secondary | ICD-10-CM

## 2023-01-28 DIAGNOSIS — I480 Paroxysmal atrial fibrillation: Secondary | ICD-10-CM | POA: Diagnosis present

## 2023-01-28 DIAGNOSIS — D6869 Other thrombophilia: Secondary | ICD-10-CM | POA: Insufficient documentation

## 2023-01-28 DIAGNOSIS — I129 Hypertensive chronic kidney disease with stage 1 through stage 4 chronic kidney disease, or unspecified chronic kidney disease: Secondary | ICD-10-CM | POA: Diagnosis not present

## 2023-01-28 NOTE — Progress Notes (Signed)
Electrophysiology Office Note:   Date:  01/28/2023  ID:  Kim Wang, Kim Wang, Kim Wang  Primary Cardiologist: Kim Balsam, MD Electrophysiologist: Kim Lemming, MD      History of Present Illness:   Kim Wang is a 77 y.o. female with h/o atrial fibrillation seen today for routine electrophysiology followup.   Since last being seen in our clinic the patient reports doing overall well.  She had COVID around Labor Day.  Around that time, she did go back into atrial fibrillation.  She felt palpitations and possibly fatigue and shortness of breath, though she was sick with COVID at that time.  She was admitted to the hospital for 1 night in [place], and converted back to normal rhythm.  Since then, she has had no further arrhythmias.  she denies chest pain, palpitations, dyspnea, PND, orthopnea, nausea, vomiting, dizziness, syncope, edema, weight gain, or early satiety.    She has a history significant for atrial fibrillation, hypertension, hyperlipidemia.  She was started on flecainide for atrial fibrillation.  Flecainide dose was increased and she was taken to 50 mg twice daily as well as metoprolol.  She presented Kim Wang with a junctional escape rhythm and heart rates in the 20s.  Metoprolol and flecainide were both stopped and her heart rate improved.  She was discharged and had more episodes of atrial fibrillation.  She is now post ablation 03/08/2022.   Post ablation she had a few episodes of atrial fibrillation.  She had a COVID-19 vaccination and had an episode of atrial fibrillation after that.  She also has to take Valtrex for cold sores and had 1 episode after that.  She has not had any episodes of atrial fibrillation since the middle of December.     Review of systems complete and found to be negative unless listed in HPI.   EP Information / Studies Reviewed:    EKG is not ordered today. EKG from 12/13/22 reviewed which showed sinus  rhythm        Risk Assessment/Calculations:    CHA2DS2-VASc Score = 4   This indicates a 4.8% annual risk of stroke. The patient's score is based upon: CHF History: 0 HTN History: 1 Diabetes History: 0 Stroke History: 0 Vascular Disease History: 0 Age Score: 2 Gender Score: 1             Physical Exam:   VS:  There were no vitals taken for this visit.   Wt Readings from Last 3 Encounters:  01/28/23 157 lb 12.8 oz (71.6 kg)  12/13/22 160 lb 3.2 oz (72.7 kg)  08/29/22 157 lb 9.6 oz (71.5 kg)     GEN: Well nourished, well developed in no acute distress NECK: No JVD; No carotid bruits CARDIAC: Regular rate and rhythm, no murmurs, rubs, gallops RESPIRATORY:  Clear to auscultation without rales, wheezing or rhonchi  ABDOMEN: Soft, non-tender, non-distended EXTREMITIES:  No edema; No deformity   ASSESSMENT AND PLAN:    1.  Paroxysmal atrial fibrillation: Currently on diltiazem and Multaq.  Post ablation 12/29/2021.  She is unfortunately continued to have episodes of atrial fibrillation.  She had an episode when she was diagnosed with COVID-19.  She converted to sinus rhythm on her own.  She is happy on her Multaq for now, but may wish to consider ablation again.  She Kim Wang call us and let us know.  2.  Hypertension: Currently well-controlled  3.  Secondary hypercoagulable state: Currently on Eliquis for atrial fibrillation  Follow up with Dr. Elberta Wang in 6 months  Signed, Kim Cuadros Jorja Loa, MD

## 2023-01-28 NOTE — Patient Instructions (Signed)
Medication Instructions:  Your physician recommends that you continue on your current medications as directed. Please refer to the Current Medication list given to you today.  *If you need a refill on your cardiac medications before your next appointment, please call your pharmacy*   Lab Work: NONE If you have labs (blood work) drawn today and your tests are completely normal, you will receive your results only by: MyChart Message (if you have MyChart) OR A paper copy in the mail If you have any lab test that is abnormal or we need to change your treatment, we will call you to review the results.   Testing/Procedures: NONE   Follow-Up: At [facility], you and your health needs are our priority.  As part of our continuing mission to provide you with exceptional heart care, we have created designated Provider Care Teams.  These Care Teams include your primary Cardiologist (physician) and Advanced Practice Providers (APPs -  Physician Assistants and Nurse Practitioners) who all work together to provide you with the care you need, when you need it.  We recommend signing up for the patient portal called "MyChart".  Sign up information is provided on this After Visit Summary.  MyChart is used to connect with patients for Virtual Visits (Telemedicine).  Patients are able to view lab/test results, encounter notes, upcoming appointments, etc.  Non-urgent messages can be sent to your provider as well.   To learn more about what you can do with MyChart, go to [email or web address].    Your next appointment:   6 month(s)  Provider:   Robert Krasowski, MD    Other Instructions   

## 2023-02-18 DIAGNOSIS — Z6827 Body mass index (BMI) 27.0-27.9, adult: Secondary | ICD-10-CM | POA: Diagnosis not present

## 2023-02-18 DIAGNOSIS — E785 Hyperlipidemia, unspecified: Secondary | ICD-10-CM | POA: Diagnosis not present

## 2023-02-18 DIAGNOSIS — I4891 Unspecified atrial fibrillation: Secondary | ICD-10-CM | POA: Diagnosis not present

## 2023-02-18 DIAGNOSIS — I129 Hypertensive chronic kidney disease with stage 1 through stage 4 chronic kidney disease, or unspecified chronic kidney disease: Secondary | ICD-10-CM | POA: Diagnosis not present

## 2023-02-18 DIAGNOSIS — N182 Chronic kidney disease, stage 2 (mild): Secondary | ICD-10-CM | POA: Diagnosis not present

## 2023-02-28 DIAGNOSIS — N179 Acute kidney failure, unspecified: Secondary | ICD-10-CM | POA: Diagnosis not present

## 2023-02-28 DIAGNOSIS — I129 Hypertensive chronic kidney disease with stage 1 through stage 4 chronic kidney disease, or unspecified chronic kidney disease: Secondary | ICD-10-CM | POA: Diagnosis not present

## 2023-03-20 ENCOUNTER — Other Ambulatory Visit (HOSPITAL_COMMUNITY): Payer: Self-pay | Admitting: Physician Assistant

## 2023-03-29 ENCOUNTER — Other Ambulatory Visit: Payer: Self-pay | Admitting: Cardiology

## 2023-04-01 NOTE — Telephone Encounter (Signed)
Rx refill sent to pharmacy. 

## 2023-04-03 ENCOUNTER — Telehealth: Payer: Self-pay | Admitting: Cardiology

## 2023-04-03 DIAGNOSIS — I4819 Other persistent atrial fibrillation: Secondary | ICD-10-CM

## 2023-04-03 NOTE — Telephone Encounter (Signed)
Patient is requesting call back to discuss scheduling another ablation per Dr. Elberta Fortis.

## 2023-04-03 NOTE — Telephone Encounter (Signed)
Left detailed message that office would call to arrange ablation. Aware if may be several weeks/more before hearing from [place]. Advised to call office if she needs anything more.

## 2023-04-17 DIAGNOSIS — Z0000 Encounter for general adult medical examination without abnormal findings: Secondary | ICD-10-CM | POA: Diagnosis not present

## 2023-04-17 DIAGNOSIS — Z139 Encounter for screening, unspecified: Secondary | ICD-10-CM | POA: Diagnosis not present

## 2023-04-17 DIAGNOSIS — Z136 Encounter for screening for cardiovascular disorders: Secondary | ICD-10-CM | POA: Diagnosis not present

## 2023-04-17 DIAGNOSIS — Z1331 Encounter for screening for depression: Secondary | ICD-10-CM | POA: Diagnosis not present

## 2023-04-17 DIAGNOSIS — Z1339 Encounter for screening examination for other mental health and behavioral disorders: Secondary | ICD-10-CM | POA: Diagnosis not present

## 2023-04-17 DIAGNOSIS — E669 Obesity, unspecified: Secondary | ICD-10-CM | POA: Diagnosis not present

## 2023-04-26 NOTE — Telephone Encounter (Signed)
Pt scheduled for ablation in March (she is going on a cruise in February).  Aware I will follow up after next week to go over instructions, before she leaves for cruise. Patient verbalized understanding and agreeable to plan.

## 2023-05-03 ENCOUNTER — Telehealth: Payer: Self-pay | Admitting: Cardiology

## 2023-05-03 NOTE — Telephone Encounter (Signed)
LVM for pt to call regarding message left earlier

## 2023-05-03 NOTE — Telephone Encounter (Signed)
 Patient is requesting call back to discuss scheduling of any future appts and if she should wait until after ablation procedure. Requesting call back to discuss further.

## 2023-05-10 NOTE — Telephone Encounter (Signed)
 Left message with spouse to call regarding appt message

## 2023-05-14 ENCOUNTER — Ambulatory Visit: Payer: PPO | Attending: Cardiology | Admitting: Cardiology

## 2023-05-14 ENCOUNTER — Encounter: Payer: Self-pay | Admitting: Cardiology

## 2023-05-14 VITALS — BP 118/70 | HR 67 | Ht 64.0 in | Wt 165.0 lb

## 2023-05-14 DIAGNOSIS — Z79899 Other long term (current) drug therapy: Secondary | ICD-10-CM

## 2023-05-14 DIAGNOSIS — E782 Mixed hyperlipidemia: Secondary | ICD-10-CM

## 2023-05-14 DIAGNOSIS — D6869 Other thrombophilia: Secondary | ICD-10-CM | POA: Diagnosis not present

## 2023-05-14 DIAGNOSIS — I10 Essential (primary) hypertension: Secondary | ICD-10-CM

## 2023-05-14 DIAGNOSIS — I480 Paroxysmal atrial fibrillation: Secondary | ICD-10-CM

## 2023-05-14 NOTE — Patient Instructions (Signed)
 Medication Instructions:  Your physician recommends that you continue on your current medications as directed. Please refer to the Current Medication list given to you today.  *If you need a refill on your cardiac medications before your next appointment, please call your pharmacy*   Lab Work: NONE If you have labs (blood work) drawn today and your tests are completely normal, you will receive your results only by: MyChart Message (if you have MyChart) OR A paper copy in the mail If you have any lab test that is abnormal or we need to change your treatment, we will call you to review the results.   Testing/Procedures: NONE   Follow-Up: At [facility], you and your health needs are our priority.  As part of our continuing mission to provide you with exceptional heart care, we have created designated Provider Care Teams.  These Care Teams include your primary Cardiologist (physician) and Advanced Practice Providers (APPs -  Physician Assistants and Nurse Practitioners) who all work together to provide you with the care you need, when you need it.  We recommend signing up for the patient portal called "MyChart".  Sign up information is provided on this After Visit Summary.  MyChart is used to connect with patients for Virtual Visits (Telemedicine).  Patients are able to view lab/test results, encounter notes, upcoming appointments, etc.  Non-urgent messages can be sent to your provider as well.   To learn more about what you can do with MyChart, go to [email or web address].    Your next appointment:   6 month(s)  Provider:   Gypsy Balsam, MD    Other Instructions

## 2023-05-14 NOTE — Progress Notes (Signed)
 Cardiology Office Note:  .   Date:  05/14/2023  ID:  Kim Wang, DOB 1945/06/02, MRN [id number] PCP: Trinidad Hun, MD  [facility] Providers Cardiologist:  Lamar Fitch, MD Electrophysiologist:  Soyla Gladis Norton, MD    History of Present Illness: .   Kim Wang is a 78 y.o. female with a past medical history of atrial fibrillation Multaq  and Eliquis , mild MR, mild TR, dyslipidemia.   03/08/2022 s/p ablation 03/01/2022 CT cardiac morphology calcium  score of 0 10/27/2021 echo EF 60 to 65%, mild concentric LVH, trace AR without stenosis, mild MR, mild TR  She presented to [facility] on 12/31/2022 noted to be in atrial fibrillation with RVR, she was also COVID-positive.  She was started on a Cardizem  drip, she eventually converted back to sinus rhythm overnight.  She was restarted on her Cardizem  to 40 mg daily, Multaq , Eliquis .  Labs on day of discharge - Na 137,  K 3.9,  Cr 0.60,  hgb 14.2, HCT 41.9.  Evaluated 01/28/2023 with general cardiology and EP for follow up after recent ED visit as outlined above. She keeps a journal of her HR and episodes of AF, she has had the one episode that she presented to the ED with. She checks her rhythm most days of the week on her Kardia mobile device. She was vacillating on wether she wants to have a repeat ablation or not.  She presents today for follow-up of her atrial fibrillation, she has decided to proceed with a repeat ablation.  She is discussed with the EP, states it is scheduled for sometime in March.  She is doing well from a cardiac perspective, offers no formal complaints.  She stays very physically active exercising most days of the week.  She did recently travel abroad, had a few episodes of atrial fibrillation that were brought on by stress. She denies chest pain, dyspnea, pnd, orthopnea, n, v, dizziness, syncope, edema, weight gain, hematochezia, hematuria, hemoptysis or early satiety.  ROS: Review of Systems   Cardiovascular:  Positive for palpitations.  All other systems reviewed and are negative.    Studies Reviewed: SABRA   EKG Interpretation Date/Time:  Tuesday May 14 2023 14:26:50 EST Ventricular Rate:  67 PR Interval:  154 QRS Duration:  80 QT Interval:  430 QTC Calculation: 454 R Axis:   30  Text Interpretation: Normal sinus rhythm Normal ECG When compared with ECG of 13-Dec-2022 13:36, Nonspecific T wave abnormality now evident in Inferior leads T wave amplitude has increased in Lateral leads Confirmed by Carlin Nest [id number]) on 05/14/2023 2:29:39 PM        Risk Assessment/Calculations:    CHA2DS2-VASc Score = 4   This indicates a 4.8% annual risk of stroke. The patient's score is based upon: CHF History: 0 HTN History: 1 Diabetes History: 0 Stroke History: 0 Vascular Disease History: 0 Age Score: 2 Gender Score: 1            Physical Exam:   VS:  BP 118/70 (BP Location: Left Arm, Patient Position: Sitting, Cuff Size: Normal)   Pulse 67   Ht 5' 4 (1.626 m)   Wt 165 lb (74.8 kg)   SpO2 96%   BMI 28.32 kg/m    Wt Readings from Last 3 Encounters:  05/14/23 165 lb (74.8 kg)  01/28/23 157 lb 12.8 oz (71.6 kg)  01/28/23 157 lb 12.8 oz (71.6 kg)    GEN: Well nourished, well developed in no acute distress NECK: No JVD;  No carotid bruits CARDIAC: RRR, no murmurs, rubs, gallops RESPIRATORY:  Clear to auscultation without rales, wheezing or rhonchi  ABDOMEN: Soft, non-tender, non-distended EXTREMITIES:  No edema; No deformity   ASSESSMENT AND PLAN: .   Atrial fibrillation/hypercoagulable state/high risk medication use-s/p ablation in November 2023, CHA2DS2-VASc score of 4.  Continue diltiazem  240 mg daily, continue Multaq  400 mg daily, continue Eliquis  5 mg twice daily-no indication for dose reduction--will see PCP next month for labs.  She has decided she wants to proceed with a repeat ablation, she has been in touch with Dr. Inocencio, this is apparently tentatively  scheduled around March or 2025.  QT 430.  Hypertension-blood pressure is well-controlled at 118/70  Dyslipidemia-currently on Lipitor 20 mg daily, monitored by her PCP.       Dispo: Follow-up with general cardiology in 6 months  Signed, Delon JAYSON Hoover, NP

## 2023-05-21 NOTE — Addendum Note (Signed)
Addended by: Baird Lyons on: 05/21/2023 05:08 PM   Modules accepted: Orders

## 2023-06-10 ENCOUNTER — Ambulatory Visit: Payer: PPO | Attending: Cardiology | Admitting: Cardiology

## 2023-06-10 ENCOUNTER — Encounter: Payer: Self-pay | Admitting: Cardiology

## 2023-06-10 ENCOUNTER — Other Ambulatory Visit: Payer: Self-pay | Admitting: *Deleted

## 2023-06-10 VITALS — BP 120/78 | HR 65 | Ht 64.0 in | Wt 159.4 lb

## 2023-06-10 DIAGNOSIS — I480 Paroxysmal atrial fibrillation: Secondary | ICD-10-CM | POA: Diagnosis not present

## 2023-06-10 DIAGNOSIS — Z01812 Encounter for preprocedural laboratory examination: Secondary | ICD-10-CM

## 2023-06-10 DIAGNOSIS — I129 Hypertensive chronic kidney disease with stage 1 through stage 4 chronic kidney disease, or unspecified chronic kidney disease: Secondary | ICD-10-CM | POA: Diagnosis not present

## 2023-06-10 DIAGNOSIS — I10 Essential (primary) hypertension: Secondary | ICD-10-CM | POA: Diagnosis not present

## 2023-06-10 DIAGNOSIS — E785 Hyperlipidemia, unspecified: Secondary | ICD-10-CM | POA: Diagnosis not present

## 2023-06-10 DIAGNOSIS — D6869 Other thrombophilia: Secondary | ICD-10-CM

## 2023-06-10 NOTE — Progress Notes (Signed)
  Electrophysiology Office Note:   Date:  06/10/2023  ID:  Kim Wang, DOB 1945-05-07, MRN [id number]  Primary Cardiologist: Ralene Burger, MD Primary Heart Failure: None Electrophysiologist: Huel Centola Cortland Ding, MD      History of Present Illness:   Kim Wang is a 78 y.o. female with h/o hypertension, atrial fibrillation seen today for routine electrophysiology followup.   Since last being seen in our clinic the patient reports doing well.  She has noted no further episodes of atrial fibrillation.  She has done well on her Multaq .  She continues to be active and has no restrictions.  She is ready for her upcoming ablation.  she denies chest pain, palpitations, dyspnea, PND, orthopnea, nausea, vomiting, dizziness, syncope, edema, weight gain, or early satiety.   Review of systems complete and found to be negative unless listed in HPI.   EP Information / Studies Reviewed:    EKG is not ordered today. EKG from 05/14/2023 reviewed which showed sinus rhythm        Risk Assessment/Calculations:    CHA2DS2-VASc Score = 4   This indicates a 4.8% annual risk of stroke. The patient's score is based upon: CHF History: 0 HTN History: 1 Diabetes History: 0 Stroke History: 0 Vascular Disease History: 0 Age Score: 2 Gender Score: 1             Physical Exam:   VS:  BP 120/78   Pulse 65   Ht 5\' 4"  (1.626 m)   Wt 159 lb 6.4 oz (72.3 kg)   SpO2 95%   BMI 27.36 kg/m    Wt Readings from Last 3 Encounters:  06/10/23 159 lb 6.4 oz (72.3 kg)  05/14/23 165 lb (74.8 kg)  01/28/23 157 lb 12.8 oz (71.6 kg)     GEN: Well nourished, well developed in no acute distress NECK: No JVD; No carotid bruits CARDIAC: Regular rate and rhythm, no murmurs, rubs, gallops RESPIRATORY:  Clear to auscultation without rales, wheezing or rhonchi  ABDOMEN: Soft, non-tender, non-distended EXTREMITIES:  No edema; No deformity   ASSESSMENT AND PLAN:    1.  Paroxysmal atrial fibrillation:  Currently on Multaq  400 mg twice daily.  Post ablation 12/29/2021.  She has had more episodes of atrial fibrillation and has plans for upcoming ablation at the end of March.  All questions were answered.  Risks and benefits were discussed.  Risk, benefits, and alternatives to EP study and radiofrequency/pulse field ablation for afib were also discussed in detail today. These risks include but are not limited to stroke, bleeding, vascular damage, tamponade, perforation, damage to the esophagus, lungs, and other structures, pulmonary vein stenosis, worsening renal function, and death. The patient understands these risk and wishes to proceed.  We Kim Wang therefore proceed with catheter ablation at the next available time.  Carto, ICE, anesthesia are requested for the procedure.  Kim Wang also obtain CT PV protocol prior to the procedure to exclude LAA thrombus and further evaluate atrial anatomy.  Hypertension: Currently well-controlled  3.  Secondary hypercoagulable state: Currently on Eliquis  5 mg twice daily for atrial fibrillation  Follow up with Dr. Lawana Pray as usual post procedure  Signed, Kianah Harries Cortland Ding, MD

## 2023-06-13 DIAGNOSIS — Z6827 Body mass index (BMI) 27.0-27.9, adult: Secondary | ICD-10-CM | POA: Diagnosis not present

## 2023-06-13 DIAGNOSIS — I4891 Unspecified atrial fibrillation: Secondary | ICD-10-CM | POA: Diagnosis not present

## 2023-06-13 DIAGNOSIS — K219 Gastro-esophageal reflux disease without esophagitis: Secondary | ICD-10-CM | POA: Diagnosis not present

## 2023-06-18 ENCOUNTER — Encounter: Payer: Self-pay | Admitting: General Practice

## 2023-06-28 ENCOUNTER — Telehealth (HOSPITAL_COMMUNITY): Payer: Self-pay

## 2023-06-28 DIAGNOSIS — N179 Acute kidney failure, unspecified: Secondary | ICD-10-CM | POA: Diagnosis not present

## 2023-06-28 DIAGNOSIS — I129 Hypertensive chronic kidney disease with stage 1 through stage 4 chronic kidney disease, or unspecified chronic kidney disease: Secondary | ICD-10-CM | POA: Diagnosis not present

## 2023-06-28 NOTE — Telephone Encounter (Signed)
 Attempted to reach patient to discuss upcoming procedure, no answer. Left VM for patient to return call.

## 2023-07-01 ENCOUNTER — Other Ambulatory Visit: Payer: Self-pay

## 2023-07-01 ENCOUNTER — Telehealth: Payer: Self-pay | Admitting: Cardiology

## 2023-07-01 MED ORDER — DILTIAZEM HCL ER COATED BEADS 240 MG PO CP24: 240.0000 mg | ORAL_CAPSULE | Freq: Every day | ORAL | 2 refills | Status: DC

## 2023-07-01 NOTE — Telephone Encounter (Signed)
*  STAT* If patient is at the pharmacy, call can be transferred to refill team.   1. Which medications need to be refilled? (please list name of each medication and dose if known) diltiazem (CARDIZEM CD) 240 MG 24 hr capsule    2. Would you like to learn more about the convenience, safety, & potential cost savings by using the [facility]?     3. Are you open to using the [facility] (Type [facility]. ).   4. Which pharmacy/location (including street and city if local pharmacy) is medication to be sent to?  [id number] [place], [place]   5. Do they need a 30 day or 90 day supply? 90 day

## 2023-07-01 NOTE — Telephone Encounter (Addendum)
 Spoke with patient to complete one month pre-procedure call.   New medical conditions? No Recent hospitalizations or surgeries? No Started any new medications? No Patient made aware to contact office to inform of any new medications started. Any changes in activities of daily living? No  Pre-procedure testing scheduled: CT on 07/05/23 and plans to get lab work completed on tomorrow Confirmed patient is taking Eliquis and will continue taking medication before procedure or it may need to be rescheduled.  Confirmed patient is scheduled for Atrial Fibrillation Ablation on Friday, March 28 with Dr. Loman Brooklyn. Instructed patient to arrive at the Main Entrance A at [facility]: [address] [place], [place] [address] and check in at Admitting at 8:00 AM  Advised of plan to go home the same day and will only stay overnight if medically necessary. You MUST have a responsible adult to drive you home and MUST be with you the first 24 hours after you arrive home or your procedure could be cancelled.  Patient verbalized understanding to information provided and is agreeable to proceed with procedure.

## 2023-07-01 NOTE — Telephone Encounter (Signed)
 Ref Cardizem 240mg  #90 ref x 3 sent to [facility]

## 2023-07-02 DIAGNOSIS — Z01812 Encounter for preprocedural laboratory examination: Secondary | ICD-10-CM | POA: Diagnosis not present

## 2023-07-02 DIAGNOSIS — I480 Paroxysmal atrial fibrillation: Secondary | ICD-10-CM | POA: Diagnosis not present

## 2023-07-03 LAB — CBC
Hematocrit: 39.4 % (ref 34.0–46.6)
Hemoglobin: 13.3 g/dL (ref 11.1–15.9)
MCH: 32 pg (ref 26.6–33.0)
MCHC: 33.8 g/dL (ref 31.5–35.7)
MCV: 95 fL (ref 79–97)
Platelets: 259 10*3/uL (ref 150–450)
RBC: 4.16 x10E6/uL (ref 3.77–5.28)
RDW: 12.4 % (ref 11.7–15.4)
WBC: 8.3 10*3/uL (ref 3.4–10.8)

## 2023-07-03 LAB — BASIC METABOLIC PANEL
BUN/Creatinine Ratio: 18 (ref 12–28)
BUN: 17 mg/dL (ref 8–27)
CO2: 22 mmol/L (ref 20–29)
Calcium: 9.4 mg/dL (ref 8.7–10.3)
Chloride: 104 mmol/L (ref 96–106)
Creatinine, Ser: 0.96 mg/dL (ref 0.57–1.00)
Glucose: 121 mg/dL — ABNORMAL HIGH (ref 70–99)
Potassium: 4.4 mmol/L (ref 3.5–5.2)
Sodium: 139 mmol/L (ref 134–144)
eGFR: 61 mL/min/{1.73_m2} (ref >59)

## 2023-07-05 ENCOUNTER — Ambulatory Visit (HOSPITAL_COMMUNITY)
Admission: RE | Admit: 2023-07-05 | Discharge: 2023-07-05 | Disposition: A | Discharge status: Home / Self Care | Payer: PPO | Source: Ambulatory Visit | Attending: Cardiology | Admitting: Cardiology

## 2023-07-05 DIAGNOSIS — I4819 Other persistent atrial fibrillation: Secondary | ICD-10-CM | POA: Diagnosis not present

## 2023-07-05 MED ORDER — IOHEXOL 350 MG/ML SOLN
95.0000 mL | Freq: Once | INTRAVENOUS | Status: AC | PRN
  Administered 2023-07-05: 95 mL via INTRAVENOUS

## 2023-07-17 ENCOUNTER — Telehealth: Payer: Self-pay | Admitting: Cardiology

## 2023-07-17 NOTE — Telephone Encounter (Signed)
 Pt called top be sure that we have the most up to date INS information on her since she has an upcoming Ablation 07/26/23... she would like a call back with reassurance that we have handles that for her... she has [employer] and [employer] with has changed for her after January 1... will sen to the Prior auth team for review.

## 2023-07-17 NOTE — Telephone Encounter (Signed)
 Pt would like a c/b from nurse regarding Ablation. Please advise

## 2023-07-19 ENCOUNTER — Telehealth (HOSPITAL_COMMUNITY): Payer: Self-pay

## 2023-07-19 DIAGNOSIS — H9193 Unspecified hearing loss, bilateral: Secondary | ICD-10-CM | POA: Diagnosis not present

## 2023-07-19 DIAGNOSIS — I10 Essential (primary) hypertension: Secondary | ICD-10-CM | POA: Diagnosis not present

## 2023-07-19 DIAGNOSIS — M8580 Other specified disorders of bone density and structure, unspecified site: Secondary | ICD-10-CM | POA: Diagnosis not present

## 2023-07-19 DIAGNOSIS — E663 Overweight: Secondary | ICD-10-CM | POA: Diagnosis not present

## 2023-07-19 DIAGNOSIS — K219 Gastro-esophageal reflux disease without esophagitis: Secondary | ICD-10-CM | POA: Diagnosis not present

## 2023-07-19 DIAGNOSIS — I4891 Unspecified atrial fibrillation: Secondary | ICD-10-CM | POA: Diagnosis not present

## 2023-07-19 DIAGNOSIS — Z7901 Long term (current) use of anticoagulants: Secondary | ICD-10-CM | POA: Diagnosis not present

## 2023-07-19 DIAGNOSIS — E785 Hyperlipidemia, unspecified: Secondary | ICD-10-CM | POA: Diagnosis not present

## 2023-07-19 DIAGNOSIS — L719 Rosacea, unspecified: Secondary | ICD-10-CM | POA: Diagnosis not present

## 2023-07-19 DIAGNOSIS — D6869 Other thrombophilia: Secondary | ICD-10-CM | POA: Diagnosis not present

## 2023-07-19 DIAGNOSIS — Z9849 Cataract extraction status, unspecified eye: Secondary | ICD-10-CM | POA: Diagnosis not present

## 2023-07-19 NOTE — Telephone Encounter (Signed)
 Call placed to patient to discuss upcoming procedure.   CT: completed.  Labs: completed.   Any recent signs of acute illness or been started on antibiotics? No Any new medications started? No Any medications to hold? No Any missed doses of blood thinner? No Advised patient to continue taking ANTICOAGULANT: Eliquis (Apixaban) without missing any doses.  Medication instructions:  On the morning of your procedure DO NOT take any medication., including Eliquis or the procedure may be rescheduled. Nothing to eat or drink after midnight prior to your procedure.  Confirmed patient is scheduled for Atrial Fibrillation Ablation on Friday, March 28 with Dr. Loman Brooklyn. Instructed patient to arrive at the Main Entrance A at [facility]: [address] [place], [place] [address] and check in at Admitting at 8:00 AM  Advised of plan to go home the same day and will only stay overnight if medically necessary. You MUST have a responsible adult to drive you home and MUST be with you the first 24 hours after you arrive home or your procedure could be cancelled.  Patient verbalized understanding to all instructions provided and agreed to proceed with procedure.

## 2023-07-25 NOTE — Pre-Procedure Instructions (Signed)
 Instructed patient on the following items: Arrival time 0800 Nothing to eat or drink after midnight No meds AM of procedure Responsible person to drive you home and stay with you for 24 hrs  Have you missed any doses of anti-coagulant Eliquis- takes twice a day, hasn't missed any doses.  Don't take dose morning of procedure.

## 2023-07-26 ENCOUNTER — Other Ambulatory Visit: Payer: Self-pay

## 2023-07-26 ENCOUNTER — Ambulatory Visit (HOSPITAL_COMMUNITY): Payer: Self-pay

## 2023-07-26 ENCOUNTER — Encounter (HOSPITAL_COMMUNITY)
Admission: RE | Disposition: A | Discharge status: Home / Self Care | Payer: Self-pay | Source: Home / Self Care | Attending: Cardiology

## 2023-07-26 ENCOUNTER — Ambulatory Visit (HOSPITAL_BASED_OUTPATIENT_CLINIC_OR_DEPARTMENT_OTHER): Payer: Self-pay

## 2023-07-26 ENCOUNTER — Ambulatory Visit (HOSPITAL_COMMUNITY)
Admission: RE | Admit: 2023-07-26 | Discharge: 2023-07-26 | Disposition: A | Discharge status: Home / Self Care | Payer: PPO | Attending: Cardiology | Admitting: Cardiology

## 2023-07-26 ENCOUNTER — Encounter (HOSPITAL_COMMUNITY): Payer: Self-pay | Admitting: Cardiology

## 2023-07-26 DIAGNOSIS — E785 Hyperlipidemia, unspecified: Secondary | ICD-10-CM | POA: Diagnosis not present

## 2023-07-26 DIAGNOSIS — I10 Essential (primary) hypertension: Secondary | ICD-10-CM | POA: Diagnosis not present

## 2023-07-26 DIAGNOSIS — I4891 Unspecified atrial fibrillation: Secondary | ICD-10-CM

## 2023-07-26 DIAGNOSIS — I480 Paroxysmal atrial fibrillation: Secondary | ICD-10-CM

## 2023-07-26 HISTORY — PX: ATRIAL FIBRILLATION ABLATION: EP1191

## 2023-07-26 LAB — POCT ACTIVATED CLOTTING TIME: Activated Clotting Time: 406 s

## 2023-07-26 SURGERY — ATRIAL FIBRILLATION ABLATION
Anesthesia: General

## 2023-07-26 MED ORDER — LIDOCAINE 2% (20 MG/ML) 5 ML SYRINGE
INTRAMUSCULAR | Status: DC | PRN
  Administered 2023-07-26: 60 mg via INTRAVENOUS

## 2023-07-26 MED ORDER — PHENYLEPHRINE 80 MCG/ML (10ML) SYRINGE FOR IV PUSH (FOR BLOOD PRESSURE SUPPORT)
PREFILLED_SYRINGE | INTRAVENOUS | Status: DC | PRN
  Administered 2023-07-26: 160 ug via INTRAVENOUS
  Administered 2023-07-26: 80 ug via INTRAVENOUS

## 2023-07-26 MED ORDER — PROPOFOL 10 MG/ML IV BOLUS
INTRAVENOUS | Status: DC | PRN
  Administered 2023-07-26: 120 mg via INTRAVENOUS

## 2023-07-26 MED ORDER — SODIUM CHLORIDE 0.9 % IV SOLN: INTRAVENOUS | Status: DC

## 2023-07-26 MED ORDER — FENTANYL CITRATE (PF) 250 MCG/5ML IJ SOLN
INTRAMUSCULAR | Status: DC | PRN
  Administered 2023-07-26: 50 ug via INTRAVENOUS

## 2023-07-26 MED ORDER — SUGAMMADEX SODIUM 200 MG/2ML IV SOLN
INTRAVENOUS | Status: DC | PRN
  Administered 2023-07-26: 200 mg via INTRAVENOUS

## 2023-07-26 MED ORDER — PROTAMINE SULFATE 10 MG/ML IV SOLN
INTRAVENOUS | Status: DC | PRN
  Administered 2023-07-26: 40 mg via INTRAVENOUS

## 2023-07-26 MED ORDER — ONDANSETRON HCL 4 MG/2ML IJ SOLN: 4.0000 mg | Freq: Four times a day (QID) | INTRAMUSCULAR | Status: DC | PRN

## 2023-07-26 MED ORDER — FENTANYL CITRATE (PF) 100 MCG/2ML IJ SOLN
INTRAMUSCULAR | Status: AC
  Filled 2023-07-26: qty 2

## 2023-07-26 MED ORDER — ATROPINE SULFATE 1 MG/ML IV SOLN
INTRAVENOUS | Status: DC | PRN
Start: 2023-07-26 — End: 2023-07-26
  Administered 2023-07-26: 1 mg via INTRAVENOUS

## 2023-07-26 MED ORDER — ATROPINE SULFATE 1 MG/10ML IJ SOSY
PREFILLED_SYRINGE | INTRAMUSCULAR | Status: AC
  Filled 2023-07-26: qty 10

## 2023-07-26 MED ORDER — HEPARIN SODIUM (PORCINE) 1000 UNIT/ML IJ SOLN
INTRAMUSCULAR | Status: DC | PRN
  Administered 2023-07-26: 14000 [IU] via INTRAVENOUS

## 2023-07-26 MED ORDER — ROCURONIUM BROMIDE 10 MG/ML (PF) SYRINGE
PREFILLED_SYRINGE | INTRAVENOUS | Status: DC | PRN
Start: 2023-07-26 — End: 2023-07-26
  Administered 2023-07-26: 50 mg via INTRAVENOUS
  Administered 2023-07-26: 10 mg via INTRAVENOUS

## 2023-07-26 MED ORDER — PHENYLEPHRINE HCL-NACL 20-0.9 MG/250ML-% IV SOLN
INTRAVENOUS | Status: DC | PRN
  Administered 2023-07-26: 30 ug/min via INTRAVENOUS

## 2023-07-26 MED ORDER — SODIUM CHLORIDE 0.9 % IV SOLN: 250.0000 mL | INTRAVENOUS | Status: DC | PRN

## 2023-07-26 MED ORDER — ONDANSETRON HCL 4 MG/2ML IJ SOLN
INTRAMUSCULAR | Status: DC | PRN
  Administered 2023-07-26: 4 mg via INTRAVENOUS

## 2023-07-26 MED ORDER — ACETAMINOPHEN 325 MG PO TABS: 650.0000 mg | ORAL_TABLET | ORAL | Status: DC | PRN

## 2023-07-26 MED ORDER — HEPARIN (PORCINE) IN NACL 1000-0.9 UT/500ML-% IV SOLN
INTRAVENOUS | Status: DC | PRN
  Administered 2023-07-26 (×3): 500 mL

## 2023-07-26 MED ORDER — SODIUM CHLORIDE 0.9% FLUSH: 3.0000 mL | INTRAVENOUS | Status: DC | PRN

## 2023-07-26 MED ORDER — DEXAMETHASONE SODIUM PHOSPHATE 10 MG/ML IJ SOLN
INTRAMUSCULAR | Status: DC | PRN
  Administered 2023-07-26: 5 mg via INTRAVENOUS

## 2023-07-26 SURGICAL SUPPLY — 20 items
BAG SNAP BAND KOVER 36X36 (MISCELLANEOUS) IMPLANT
CABLE PFA RX CATH CONN (CABLE) IMPLANT
CATH FARAWAVE ABLATION 31 (CATHETERS) IMPLANT
CATH OCTARAY 2.0 F 3-3-3-3-3 (CATHETERS) IMPLANT
CATH SOUNDSTAR ECO 8FR (CATHETERS) IMPLANT
CATH WEB BI DIR CSDF CRV REPRO (CATHETERS) IMPLANT
CLOSURE MYNX CONTROL 6F/7F (Vascular Products) IMPLANT
CLOSURE PERCLOSE PROSTYLE (VASCULAR PRODUCTS) IMPLANT
COVER SWIFTLINK CONNECTOR (BAG) ×1 IMPLANT
DILATOR VESSEL 38 20CM 16FR (INTRODUCER) IMPLANT
GUIDEWIRE INQWIRE 1.5J.035X260 (WIRE) IMPLANT
INQWIRE 1.5J .035X260CM (WIRE) ×1 IMPLANT
KIT VERSACROSS CNCT FARADRIVE (KITS) IMPLANT
PACK EP LF (CUSTOM PROCEDURE TRAY) ×1 IMPLANT
PAD DEFIB RADIO PHYSIO CONN (PAD) ×1 IMPLANT
PATCH CARTO3 (PAD) IMPLANT
SHEATH FARADRIVE STEERABLE (SHEATH) IMPLANT
SHEATH PINNACLE 8F 10CM (SHEATH) IMPLANT
SHEATH PINNACLE 9F 10CM (SHEATH) IMPLANT
SHEATH PROBE COVER 6X72 (BAG) IMPLANT

## 2023-07-26 NOTE — Anesthesia Preprocedure Evaluation (Signed)
 Anesthesia Evaluation  Patient identified by MRN, date of birth, ID band Patient awake    Reviewed: Allergy & Precautions, NPO status , Patient's Chart, lab work & pertinent test results, reviewed documented beta blocker date and time   Airway Mallampati: I  TM Distance: >3 FB Neck ROM: Full    Dental no notable dental hx. (+) Teeth Intact, Dental Advisory Given   Pulmonary neg pulmonary ROS   Pulmonary exam normal breath sounds clear to auscultation       Cardiovascular hypertension, Pt. on home beta blockers and Pt. on medications Normal cardiovascular exam+ dysrhythmias (eliquis) Atrial Fibrillation  Rhythm:Regular Rate:Normal  TTE 2023 Normal EF, valves ok   Neuro/Psych negative neurological ROS  negative psych ROS   GI/Hepatic negative GI ROS, Neg liver ROS,,,  Endo/Other  negative endocrine ROS    Renal/GU negative Renal ROS  negative genitourinary   Musculoskeletal negative musculoskeletal ROS (+)    Abdominal   Peds  Hematology negative hematology ROS (+)   Anesthesia Other Findings   Reproductive/Obstetrics                             Anesthesia Physical Anesthesia Plan  ASA: 3  Anesthesia Plan: General   Post-op Pain Management: Minimal or no pain anticipated   Induction: Intravenous  PONV Risk Score and Plan: 3 and Midazolam, Dexamethasone and Ondansetron  Airway Management Planned: Oral ETT  Additional Equipment:   Intra-op Plan:   Post-operative Plan: Extubation in OR  Informed Consent: I have reviewed the patients History and Physical, chart, labs and discussed the procedure including the risks, benefits and alternatives for the proposed anesthesia with the patient or authorized representative who has indicated his/her understanding and acceptance.     Dental advisory given  Plan Discussed with: CRNA  Anesthesia Plan Comments:        Anesthesia Quick  Evaluation

## 2023-07-26 NOTE — Anesthesia Procedure Notes (Addendum)
 Procedure Name: Intubation Date/Time: 07/26/2023 9:54 AM  Performed by: Camillia Herter, CRNAPre-anesthesia Checklist: Patient identified, Emergency Drugs available, Suction available and Patient being monitored Patient Re-evaluated:Patient Re-evaluated prior to induction Oxygen Delivery Method: Circle System Utilized Preoxygenation: Pre-oxygenation with 100% oxygen Induction Type: IV induction Ventilation: Mask ventilation without difficulty Laryngoscope Size: Mac and 4 Grade View: Grade II Tube type: Oral Tube size: 7.0 mm Number of attempts: 1 Airway Equipment and Method: Stylet Placement Confirmation: ETT inserted through vocal cords under direct vision, positive ETCO2 and breath sounds checked- equal and bilateral Secured at: 22 cm Tube secured with: Tape Dental Injury: Teeth and Oropharynx as per pre-operative assessment  Comments: SRNA attempt x1: Grade 2 view on original DL, BURP maneuver yielded Grade 1 view

## 2023-07-26 NOTE — Anesthesia Postprocedure Evaluation (Signed)
 Anesthesia Post Note  Patient: Kim Wang  Procedure(s) Performed: ATRIAL FIBRILLATION ABLATION     Patient location during evaluation: PACU Anesthesia Type: General Level of consciousness: awake and alert Pain management: pain level controlled Vital Signs Assessment: post-procedure vital signs reviewed and stable Respiratory status: spontaneous breathing, nonlabored ventilation, respiratory function stable and patient connected to nasal cannula oxygen Cardiovascular status: blood pressure returned to baseline and stable Postop Assessment: no apparent nausea or vomiting Anesthetic complications: no  There were no known notable events for this encounter.  Last Vitals:  Vitals:   07/26/23 1145 07/26/23 1200  BP: 134/80 130/76  Pulse: 71 71  Resp: 12 20  Temp:    SpO2: 95% 95%    Last Pain:  Vitals:   07/26/23 1136  TempSrc: Axillary  PainSc: 0-No pain                 Esthela Brandner L Rune Mendez

## 2023-07-26 NOTE — Transfer of Care (Signed)
 Immediate Anesthesia Transfer of Care Note  Patient: Kim Wang  Procedure(s) Performed: ATRIAL FIBRILLATION ABLATION  Patient Location: PACU  Anesthesia Type:General  Level of Consciousness: drowsy, patient cooperative, and responds to stimulation  Airway & Oxygen Therapy: Patient connected to nasal cannula oxygen  Post-op Assessment: Report given to RN, Post -op Vital signs reviewed and stable, and Patient moving all extremities X 4  Post vital signs: Reviewed and stable  Last Vitals:  Vitals Value Taken Time  BP 120/77 07/26/23 1110  Temp 36.7 C 07/26/23 1104  Pulse 75 07/26/23 1110  Resp 9 07/26/23 1110  SpO2 96 % 07/26/23 1110  Vitals shown include unfiled device data.  Last Pain:  Vitals:   07/26/23 1104  TempSrc: Axillary  PainSc: Asleep         Complications: There were no known notable events for this encounter.

## 2023-07-26 NOTE — Discharge Instructions (Signed)
 Cardiac Ablation, Care After  This sheet gives you information about how to care for yourself after your procedure. Your health care provider may also give you more specific instructions. If you have problems or questions, contact your health care provider. What can I expect after the procedure? After the procedure, it is common to have: Bruising around your puncture site. Tenderness around your puncture site. Skipped heartbeats. If you had an atrial fibrillation ablation, you may have atrial fibrillation during the first several months after your procedure.  Tiredness (fatigue).  Follow these instructions at home: Puncture site care   If a large square bandage is present, this may be removed 24 hours after surgery.  Check your puncture site every day for signs of infection. Check for: Redness, swelling, or pain. Fluid or blood. If your puncture site starts to bleed, lie down on your back, apply firm pressure to the area, and contact your health care provider. Warmth. Pus or a bad smell. A pea or marble sized lump/knot at the site is normal and can take up to three months to resolve.  Driving Do not drive for at least 4 days after your procedure or however long your health care provider recommends. (Do not resume driving if you have previously been instructed not to drive for other health reasons.) Do not drive or use heavy machinery while taking prescription pain medicine. Activity Avoid activities that take a lot of effort for at least 7 days after your procedure. Do not lift anything that is heavier than 5 lb (4.5 kg) for one week.  No sexual activity for 1 week.  Return to your normal activities as told by your health care provider. Ask your health care provider what activities are safe for you. General instructions Take over-the-counter and prescription medicines only as told by your health care provider. Do not use any products that contain nicotine or tobacco, such as cigarettes  and e-cigarettes. If you need help quitting, ask your health care provider. You may shower after 24 hours, but Do not take baths, swim, or use a hot tub for 1 week.  Do not drink alcohol for 24 hours after your procedure. Keep all follow-up visits as told by your health care provider. This is important. Contact a health care provider if: You have redness, mild swelling, or pain around your puncture site. You have fluid or blood coming from your puncture site that stops after applying firm pressure to the area. Your puncture site feels warm to the touch. You have pus or a bad smell coming from your puncture site. You have a fever. You have chest pain or discomfort that spreads to your neck, jaw, or arm. You have chest pain that is worse with lying on your back or taking a deep breath. You are sweating a lot. You feel nauseous. You have a fast or irregular heartbeat. You have shortness of breath. You are dizzy or light-headed and feel the need to lie down. You have pain or numbness in the arm or leg closest to your puncture site. Get help right away if: Your puncture site suddenly swells. Your puncture site is bleeding and the bleeding does not stop after applying firm pressure to the area. These symptoms may represent a serious problem that is an emergency. Do not wait to see if the symptoms will go away. Get medical help right away. Call your local emergency services (911 in the [place] Do not drive yourself to the hospital. Summary After the procedure, it  is normal to have bruising and tenderness at the puncture site in your groin, neck, or forearm. Check your puncture site every day for signs of infection. Get help right away if your puncture site is bleeding and the bleeding does not stop after applying firm pressure to the area. This is a medical emergency. This information is not intended to replace advice given to you by your health care provider. Make sure you discuss any questions  you have with your health care provider.

## 2023-07-26 NOTE — H&P (Signed)
  Electrophysiology Office Note:   Date:  07/26/2023  ID:  Kim Wang, Kim Wang 24-Feb-1946, MRN [id number]  Primary Cardiologist: Gypsy Balsam, MD Primary Heart Failure: None Electrophysiologist: Kaushal Vannice Jorja Loa, MD      History of Present Illness:   Kim Wang is a 78 y.o. female with h/o hypertension, atrial fibrillation seen today for routine electrophysiology followup.   Today, denies symptoms of palpitations, chest pain, shortness of breath, orthopnea, PND, lower extremity edema, claudication, dizziness, presyncope, syncope, bleeding, or neurologic sequela. The patient is tolerating medications without difficulties. Plan ablation today.   EP Information / Studies Reviewed:    EKG is not ordered today. EKG from 05/14/2023 reviewed which showed sinus rhythm        Risk Assessment/Calculations:    CHA2DS2-VASc Score = 4   This indicates a 4.8% annual risk of stroke. The patient's score is based upon: CHF History: 0 HTN History: 1 Diabetes History: 0 Stroke History: 0 Vascular Disease History: 0 Age Score: 2 Gender Score: 1            Physical Exam:   VS:  There were no vitals taken for this visit.   Wt Readings from Last 3 Encounters:  06/10/23 72.3 kg  05/14/23 74.8 kg  01/28/23 71.6 kg     GEN: No acute distress.   Neck: No JVD Cardiac: RRR, no murmurs, rubs, or gallops.  Respiratory: decreased BS bases bilaterally. GI: Soft, nontender, non-distended  MS: No edema; No deformity. Neuro:  Nonfocal  Skin: warm and dry Psych: Normal affect    ASSESSMENT AND PLAN:    1.  Paroxysmal atrial fibrillation: Kim Wang has presented today for surgery, with the diagnosis of AF.  The various methods of treatment have been discussed with the patient and family. After consideration of risks, benefits and other options for treatment, the patient has consented to  Procedure(s): Catheter ablation as a surgical intervention .  Risks include but not  limited to complete heart block, stroke, esophageal damage, nerve damage, bleeding, vascular damage, tamponade, perforation, MI, and death. The patient's history has been reviewed, patient examined, no change in status, stable for surgery.  I have reviewed the patient's chart and labs.  Questions were answered to the patient's satisfaction.    Kim Bress Elberta Fortis, MD 07/26/2023 8:46 AM

## 2023-07-28 MED FILL — Fentanyl Citrate Preservative Free (PF) Inj 100 MCG/2ML: INTRAMUSCULAR | Qty: 1 | Status: AC

## 2023-07-28 MED FILL — Fentanyl Citrate Preservative Free (PF) Inj 100 MCG/2ML: INTRAMUSCULAR | Qty: 1 | Status: CN

## 2023-07-28 MED FILL — Atropine Sulfate Soln Prefill Syr 1 MG/10ML (0.1 MG/ML): INTRAMUSCULAR | Qty: 10 | Status: CN

## 2023-07-28 MED FILL — Atropine Sulfate Soln Prefill Syr 1 MG/10ML (0.1 MG/ML): INTRAMUSCULAR | Qty: 10 | Status: AC

## 2023-07-29 ENCOUNTER — Telehealth (HOSPITAL_COMMUNITY): Payer: Self-pay

## 2023-07-29 NOTE — Telephone Encounter (Signed)
 Spoke with patient to complete post procedure follow up call.  Patient removed large bandage at puncture sites and reports no complications with groin sites.   Instructions reviewed with patient:  It is normal to have bruising, tenderness and a pea or marble sized lump/knot at the groin site which can take up to three months to resolve.  Get help right away if you notice sudden swelling at the puncture site.  Check your puncture site every day for signs of infection: fever, redness, swelling, pus drainage, warmth, foul odor or excessive pain. If this occurs, please call the office at [phone], to speak with the nurse. Get help right away if your puncture site is bleeding and the bleeding does not stop after applying firm pressure to the area.  You may continue to have skipped beats/ atrial fibrillation during the first several months after your procedure.  It is very important not to miss any doses of your blood thinner Eliquis. Patient restarted taking this medication on 07/26/23.   You will follow up with the Afib clinic on 08/26/23 and with Dr.Will Camnitz on 10/14/23.   Patient verbalized understanding to all instructions provided.

## 2023-07-29 NOTE — Telephone Encounter (Signed)
 Attempted to reach patient to follow up with procedure completed on 07/26/23, no answer. Left VM for patient to return call.

## 2023-08-09 ENCOUNTER — Telehealth: Payer: Self-pay | Admitting: Cardiology

## 2023-08-09 NOTE — Telephone Encounter (Signed)
 Patient states she has a cough and runny nose, She would like to know what would be the best cold medicine to take with her cardiac medications

## 2023-08-09 NOTE — Telephone Encounter (Signed)
 Pt requesting cb to see what otc cold medications are safe for her to take

## 2023-08-09 NOTE — Telephone Encounter (Signed)
 Pt advised that she could take plain Robitussin or Coricidin cold with the heart on the box.  Advised to avoid decongestants & medications with Sudafed. Reminded that with it being allergy season that symptoms could be related to allergies as well.  She is agreeable to plan and thanks me for calling her back with these suggestions.

## 2023-08-21 ENCOUNTER — Ambulatory Visit (HOSPITAL_COMMUNITY): Payer: PPO | Admitting: Internal Medicine

## 2023-08-26 ENCOUNTER — Ambulatory Visit (HOSPITAL_COMMUNITY)
Admission: RE | Admit: 2023-08-26 | Discharge: 2023-08-26 | Disposition: A | Discharge status: Home / Self Care | Source: Ambulatory Visit | Attending: Internal Medicine | Admitting: Internal Medicine

## 2023-08-26 VITALS — BP 152/90 | HR 67 | Ht 64.0 in | Wt 164.8 lb

## 2023-08-26 DIAGNOSIS — Z79899 Other long term (current) drug therapy: Secondary | ICD-10-CM

## 2023-08-26 DIAGNOSIS — D6869 Other thrombophilia: Secondary | ICD-10-CM

## 2023-08-26 DIAGNOSIS — I480 Paroxysmal atrial fibrillation: Secondary | ICD-10-CM

## 2023-08-26 NOTE — Progress Notes (Signed)
 Primary Care Physician: Lonie Roa, MD Referring Physician:Dr. Lawana Pray Cardiologist: Dr. Sheron Dixons Kim Wang is a 78 y.o. female with a h/o afib, HTN, that is in the afib clinic today one month f/u afib ablation. History of Afib ablation 03/08/22 by Dr. Lawana Pray.  On follow up 08/26/23, patient is currently in NSR. S/p Afib ablation on 07/26/23 by Dr. Lawana Pray. No episodes of Afib since ablation. She is taking Multaq . No chest pain or SOB. Leg sites healed without issue. No missed doses of Eliquis  5 mg BID.   Today, she denies symptoms of orthopnea, PND, lower extremity edema, dizziness, presyncope, syncope, snoring, daytime somnolence, bleeding, or neurologic sequela. The patient is tolerating medications without difficulties and is otherwise without complaint today.    Past Medical History:  Diagnosis Date   Dyspnea on exertion 10/13/2021   Hyperlipidemia    on meds   Hypertension    on meds   Osteoporosis    bilateral hips   Paroxysmal atrial fibrillation (HCC) 10/13/2021   Past Surgical History:  Procedure Laterality Date   ATRIAL FIBRILLATION ABLATION N/A 03/08/2022   Procedure: ATRIAL FIBRILLATION ABLATION;  Surgeon: Lei Pump, MD;  Location: [facility];  Service: Cardiovascular;  Laterality: N/A;   ATRIAL FIBRILLATION ABLATION N/A 07/26/2023   Procedure: ATRIAL FIBRILLATION ABLATION;  Surgeon: Lei Pump, MD;  Location: [facility];  Service: Cardiovascular;  Laterality: N/A;   COLONOSCOPY  2012   RG-normal/tics- 10 yr recall   TUMOR REMOVAL Left    LEFT leg lymphoid tumor removed    Current Outpatient Medications  Medication Sig Dispense Refill   acetaminophen  (TYLENOL ) 500 MG tablet Take 500 mg by mouth as needed for moderate pain (pain score 4-6).     acyclovir  ointment (ZOVIRAX ) 5 % Apply 1 Application topically every 3 (three) hours. (Patient taking differently: Apply 1 Application topically as needed.) 15 g 1   apixaban   (ELIQUIS ) 5 MG TABS tablet Take 5 mg by mouth 2 (two) times daily.     atorvastatin  (LIPITOR) 20 MG tablet Take 20 mg by mouth daily.     Calcium  Carbonate-Vitamin D 600-200 MG-UNIT TABS Take 2 tablets by mouth daily.     diltiazem  (CARDIZEM  CD) 240 MG 24 hr capsule Take 1 capsule (240 mg total) by mouth daily. 90 capsule 2   dronedarone  (MULTAQ ) 400 MG tablet TAKE 1 TABLET TWICE A DAY WITH MEALS 180 tablet 2   ezetimibe (ZETIA) 10 MG tablet Take 10 mg by mouth daily.     metoprolol  tartrate (LOPRESSOR ) 25 MG tablet Take 12.5 mg by mouth daily as needed (Rapid heart rate over 100 bpm).     Omega 3 1200 MG CAPS Take 1,200 mg by mouth daily.     No current facility-administered medications for this encounter.    Allergies  Allergen Reactions   Flecainide  Other (See Comments)    HR decrease low 20's / organ failure   Tape Dermatitis   Zinc Rash    ROS- All systems are reviewed and negative except as per the HPI above  Physical Exam: Vitals:   08/26/23 0926  BP: (!) 152/90  Pulse: 67  Weight: 74.8 kg  Height: 5\' 4"  (1.626 m)    Wt Readings from Last 3 Encounters:  08/26/23 74.8 kg  07/26/23 72.6 kg  06/10/23 72.3 kg    Labs: Lab Results  Component Value Date   NA 139 07/02/2023   K 4.4 07/02/2023   CL  104 07/02/2023   CO2 22 07/02/2023   GLUCOSE 121 (H) 07/02/2023   BUN 17 07/02/2023   CREATININE 0.96 07/02/2023   CALCIUM  9.4 07/02/2023   MG 2.1 10/13/2021   No results found for: "INR" Lab Results  Component Value Date   CHOL 182 10/03/2022   HDL 71 10/03/2022   LDLCALC 93 10/03/2022   TRIG 99 10/03/2022   GEN- The patient is well appearing, alert and oriented x 3 today.   Neck - no JVD or carotid bruit noted Lungs- Clear to ausculation bilaterally, normal work of breathing Heart- Regular rate and rhythm, no murmurs, rubs or gallops, PMI not laterally displaced Extremities- no clubbing, cyanosis, or edema Skin - no rash or ecchymosis noted   EKG- Vent.  rate 67 BPM PR interval 162 ms QRS duration 84 ms QT/QTcB 432/456 ms P-R-T axes 73 74 45 Normal sinus rhythm Nonspecific ST and T wave abnormality Abnormal ECG When compared with ECG of 26-Jul-2023 11:42, Nonspecific T wave abnormality now evident in Anterior leads QT has shortened Confirmed by Minnie Amber ([id number]) on 08/26/2023 10:03:35 AM  ECHO 09/30/21 (outside study) showed normal LV function (patient was in Afib at time of study).  Assessment and Plan:  1. Afib  S/p Afib ablation on 07/26/23 by Dr. Lawana Pray.  She is currently in NSR. She has been doing well since ablation.    High risk medication monitoring (ICD10: [id number]) Patient requires ongoing monitoring for anti-arrhythmic medication which has the potential to cause life threatening arrhythmias or AV block. ECG intervals are stable. Continue Multaq  400 mg BID.   2. CHA2DS2VASc  score of at least 4 Continue Eliquis  5 mg BID without interruption.    Follow up with Dr. Lawana Pray as scheduled.   Woody Heading, PA-C Afib Clinic [facility] [address] [place], [place] [address] [phone]

## 2023-08-28 DIAGNOSIS — I129 Hypertensive chronic kidney disease with stage 1 through stage 4 chronic kidney disease, or unspecified chronic kidney disease: Secondary | ICD-10-CM | POA: Diagnosis not present

## 2023-09-18 DIAGNOSIS — N10 Acute tubulo-interstitial nephritis: Secondary | ICD-10-CM | POA: Diagnosis not present

## 2023-09-18 DIAGNOSIS — R079 Chest pain, unspecified: Secondary | ICD-10-CM | POA: Diagnosis not present

## 2023-09-25 DIAGNOSIS — Z6827 Body mass index (BMI) 27.0-27.9, adult: Secondary | ICD-10-CM | POA: Diagnosis not present

## 2023-09-25 DIAGNOSIS — Z7689 Persons encountering health services in other specified circumstances: Secondary | ICD-10-CM | POA: Diagnosis not present

## 2023-09-25 DIAGNOSIS — N390 Urinary tract infection, site not specified: Secondary | ICD-10-CM | POA: Diagnosis not present

## 2023-09-27 DIAGNOSIS — Z7689 Persons encountering health services in other specified circumstances: Secondary | ICD-10-CM | POA: Diagnosis not present

## 2023-09-27 DIAGNOSIS — Z6826 Body mass index (BMI) 26.0-26.9, adult: Secondary | ICD-10-CM | POA: Diagnosis not present

## 2023-09-27 DIAGNOSIS — R5381 Other malaise: Secondary | ICD-10-CM | POA: Diagnosis not present

## 2023-09-27 DIAGNOSIS — B950 Streptococcus, group A, as the cause of diseases classified elsewhere: Secondary | ICD-10-CM | POA: Diagnosis not present

## 2023-09-27 DIAGNOSIS — Z20822 Contact with and (suspected) exposure to covid-19: Secondary | ICD-10-CM | POA: Diagnosis not present

## 2023-09-28 ENCOUNTER — Other Ambulatory Visit: Payer: Self-pay | Admitting: Cardiology

## 2023-09-28 DIAGNOSIS — I129 Hypertensive chronic kidney disease with stage 1 through stage 4 chronic kidney disease, or unspecified chronic kidney disease: Secondary | ICD-10-CM | POA: Diagnosis not present

## 2023-09-30 NOTE — Telephone Encounter (Signed)
 Prescription refill request for Eliquis  received. Indication: PAF Last office visit: 08/26/23  Jamas Maywood PA-C Scr: 0.96 on 07/02/23 Age: 78 Weight: 74.8kg  Based on above findings Eliquis  5mg  twice daily is the appropriate dose.  Refill approved.

## 2023-10-03 DIAGNOSIS — N390 Urinary tract infection, site not specified: Secondary | ICD-10-CM | POA: Diagnosis not present

## 2023-10-03 DIAGNOSIS — Z6826 Body mass index (BMI) 26.0-26.9, adult: Secondary | ICD-10-CM | POA: Diagnosis not present

## 2023-10-03 DIAGNOSIS — B3731 Acute candidiasis of vulva and vagina: Secondary | ICD-10-CM | POA: Diagnosis not present

## 2023-10-07 ENCOUNTER — Other Ambulatory Visit: Payer: Self-pay

## 2023-10-07 ENCOUNTER — Telehealth: Payer: Self-pay | Admitting: Cardiology

## 2023-10-07 MED ORDER — APIXABAN 5 MG PO TABS: 5.0000 mg | ORAL_TABLET | Freq: Two times a day (BID) | ORAL | 1 refills | Status: DC

## 2023-10-07 NOTE — Telephone Encounter (Signed)
 Prescription refill request for Eliquis  received. Indication:AFIB Last office visit:4/25 Scr:0.96  3/25 Age: 78 Weight:74.8  KG  Prescription refilled

## 2023-10-07 NOTE — Telephone Encounter (Signed)
 Pt c/o medication issue:  1. Name of Medication: apixaban  (ELIQUIS ) 5 MG TABS tablet   2. How are you currently taking this medication (dosage and times per day)? As written   3. Are you having a reaction (difficulty breathing--STAT)? No   4. What is your medication issue? Pt called in stating [facility] told her this rx needs provider approval before filling. Please advise.

## 2023-10-08 ENCOUNTER — Other Ambulatory Visit: Payer: Self-pay

## 2023-10-09 ENCOUNTER — Telehealth: Payer: Self-pay | Admitting: Cardiology

## 2023-10-09 DIAGNOSIS — I480 Paroxysmal atrial fibrillation: Secondary | ICD-10-CM

## 2023-10-09 MED ORDER — APIXABAN 5 MG PO TABS: 5.0000 mg | ORAL_TABLET | Freq: Two times a day (BID) | ORAL | 1 refills | Status: DC

## 2023-10-09 NOTE — Telephone Encounter (Signed)
 Eliquis  5mg  refill request received. Patient is 78 years old, weight-74.8kg, Crea-0.96 on 07/02/23, Diagnosis-Afib, and last seen by Minnie Amber on 08/26/23. Dose is appropriate based on dosing criteria. Will send in refill to requested pharmacy.

## 2023-10-09 NOTE — Telephone Encounter (Signed)
*  STAT* If patient is at the pharmacy, call can be transferred to refill team.   1. Which medications need to be refilled? (please list name of each medication and dose if known)   apixaban (ELIQUIS) 5 MG TABS tablet     2. Which pharmacy/location (including street and city if local pharmacy) is medication to be sent to? [id number] - [place], [place] - [address]  3. Do they need a 30 day or 90 day supply? 90 day

## 2023-10-14 ENCOUNTER — Encounter: Payer: Self-pay | Admitting: Cardiology

## 2023-10-14 ENCOUNTER — Ambulatory Visit: Payer: PPO | Attending: Cardiology | Admitting: Cardiology

## 2023-10-14 VITALS — BP 140/80 | HR 81 | Ht 64.0 in | Wt 157.6 lb

## 2023-10-14 DIAGNOSIS — R519 Headache, unspecified: Secondary | ICD-10-CM | POA: Diagnosis not present

## 2023-10-14 DIAGNOSIS — R350 Frequency of micturition: Secondary | ICD-10-CM | POA: Diagnosis not present

## 2023-10-14 DIAGNOSIS — Z6827 Body mass index (BMI) 27.0-27.9, adult: Secondary | ICD-10-CM | POA: Diagnosis not present

## 2023-10-14 DIAGNOSIS — D6869 Other thrombophilia: Secondary | ICD-10-CM

## 2023-10-14 DIAGNOSIS — I10 Essential (primary) hypertension: Secondary | ICD-10-CM | POA: Diagnosis not present

## 2023-10-14 DIAGNOSIS — I480 Paroxysmal atrial fibrillation: Secondary | ICD-10-CM

## 2023-10-14 DIAGNOSIS — R5383 Other fatigue: Secondary | ICD-10-CM | POA: Diagnosis not present

## 2023-10-14 DIAGNOSIS — Z20822 Contact with and (suspected) exposure to covid-19: Secondary | ICD-10-CM | POA: Diagnosis not present

## 2023-10-14 NOTE — Patient Instructions (Signed)
 Medication Instructions:  Your physician has recommended you make the following change in your medication:  STOP Multaq  at the end of this month  *If you need a refill on your cardiac medications before your next appointment, please call your pharmacy*  Lab Work: None ordered  If you have any lab test that is abnormal or we need to change your treatment, we will call you to review the results.  Testing/Procedures: None ordered  Follow-Up: At [facility], you and your health needs are our priority.  As part of our continuing mission to provide you with exceptional heart care, our providers are all part of one team.  This team includes your primary Cardiologist (physician) and Advanced Practice Providers or APPs (Physician Assistants and Nurse Practitioners) who all work together to provide you with the care you need, when you need it.  Your next appointment:   6 month(s)  Provider:   Agatha Horsfall, MD     Thank you for choosing [facility]!!   Reece Cane, RN [phone]

## 2023-10-14 NOTE — Progress Notes (Signed)
  Electrophysiology Office Note:   Date:  10/14/2023  ID:  Kim Wang, DOB 16-Apr-1946, MRN [id number]  Primary Cardiologist: Ralene Burger, MD Primary Heart Failure: None Electrophysiologist: Kynlea Blackston Cortland Ding, MD      History of Present Illness:   Kim Wang is a 78 y.o. female with h/o hypertension, atrial fibrillation seen today for routine electrophysiology followup.   Since last being seen in our clinic the patient reports doing overall well.  She is continue to have short episodes of atrial fibrillation.  She has had 5 episodes that have lasted just a few hours at a time.  She is currently dealing with a urinary tract infection.  She has follow-up planned with her primary physician today.  she denies chest pain, palpitations, dyspnea, PND, orthopnea, nausea, vomiting, dizziness, syncope, edema, weight gain, or early satiety.   Review of systems complete and found to be negative unless listed in HPI.   EP Information / Studies Reviewed:    EKG is ordered today. Personal review as below.  EKG Interpretation Date/Time:  Monday October 14 2023 15:32:51 EDT Ventricular Rate:  81 PR Interval:  154 QRS Duration:  76 QT Interval:  434 QTC Calculation: 504 R Axis:   56  Text Interpretation: Normal sinus rhythm Nonspecific ST abnormality Prolonged QT Abnormal ECG Confirmed by Adaiah Jaskot ([id number]) on 10/14/2023 3:37:19 PM     Risk Assessment/Calculations:    CHA2DS2-VASc Score = 4   This indicates a 4.8% annual risk of stroke. The patient's score is based upon: CHF History: 0 HTN History: 1 Diabetes History: 0 Stroke History: 0 Vascular Disease History: 0 Age Score: 2 Gender Score: 1            Physical Exam:   VS:  BP (!) 140/80   Pulse 81   Ht 5' 4 (1.626 m)   Wt 157 lb 9.6 oz (71.5 kg)   SpO2 98%   BMI 27.05 kg/m    Wt Readings from Last 3 Encounters:  10/14/23 157 lb 9.6 oz (71.5 kg)  08/26/23 164 lb 12.8 oz (74.8 kg)  07/26/23 160 lb (72.6 kg)      GEN: Well nourished, well developed in no acute distress NECK: No JVD; No carotid bruits CARDIAC: Regular rate and rhythm, no murmurs, rubs, gallops RESPIRATORY:  Clear to auscultation without rales, wheezing or rhonchi  ABDOMEN: Soft, non-tender, non-distended EXTREMITIES:  No edema; No deformity   ASSESSMENT AND PLAN:    1.  Paroxysmal atrial fibrillation: Post ablation 07/26/2023.  For the most part has been doing well.  She has no acute complaints at this time.  She has had 5 episodes of atrial fibrillation that have all been short-lived lasting a few hours at a time.  For now, she is happy with her control.  She wishes to continue her Multaq  until the end of the month.  2.  Hypertension: Mildly elevated but usually well-controlled.  3.  Secondary hypercoagulable state: On Eliquis   Follow up with Dr. Lawana Pray in 6 months  Signed, Leila Schuff Cort[place] Ding, MD

## 2023-10-28 DIAGNOSIS — I129 Hypertensive chronic kidney disease with stage 1 through stage 4 chronic kidney disease, or unspecified chronic kidney disease: Secondary | ICD-10-CM | POA: Diagnosis not present

## 2023-11-06 ENCOUNTER — Telehealth: Payer: Self-pay | Admitting: Cardiology

## 2023-11-06 NOTE — Telephone Encounter (Signed)
 Pt c/o medication issue:  1. Name of Medication: Multaq   2. How are you currently taking this medication (dosage and times per day)? Was taken off med 5 days by [facility]  3. Are you having a reaction (difficulty breathing--STAT)? No  4. What is your medication issue? Since coming off med pt has been experiencing fatigue, headache, flushed, HR 85-89

## 2023-11-12 ENCOUNTER — Encounter: Payer: Self-pay | Admitting: Cardiology

## 2023-11-12 ENCOUNTER — Ambulatory Visit: Attending: Cardiology | Admitting: Cardiology

## 2023-11-12 VITALS — BP 122/80 | HR 73 | Ht 64.0 in | Wt 156.0 lb

## 2023-11-12 DIAGNOSIS — I480 Paroxysmal atrial fibrillation: Secondary | ICD-10-CM | POA: Diagnosis not present

## 2023-11-12 DIAGNOSIS — H524 Presbyopia: Secondary | ICD-10-CM | POA: Diagnosis not present

## 2023-11-12 DIAGNOSIS — R0609 Other forms of dyspnea: Secondary | ICD-10-CM

## 2023-11-12 DIAGNOSIS — H52223 Regular astigmatism, bilateral: Secondary | ICD-10-CM | POA: Diagnosis not present

## 2023-11-12 DIAGNOSIS — E782 Mixed hyperlipidemia: Secondary | ICD-10-CM | POA: Diagnosis not present

## 2023-11-12 DIAGNOSIS — I10 Essential (primary) hypertension: Secondary | ICD-10-CM

## 2023-11-12 DIAGNOSIS — Z0100 Encounter for examination of eyes and vision without abnormal findings: Secondary | ICD-10-CM | POA: Diagnosis not present

## 2023-11-12 DIAGNOSIS — H35373 Puckering of macula, bilateral: Secondary | ICD-10-CM | POA: Diagnosis not present

## 2023-11-12 NOTE — Progress Notes (Signed)
 Cardiology Office Note:    Date:  11/12/2023   ID:  Jenkins Richarda Lobstein, [place] 26-Jul-1945, MRN [id number]  PCP:  Trinidad Hun, MD  Cardiologist:  Lamar Fitch, MD    Referring MD: Trinidad Hun, MD   Chief Complaint  Patient presents with   Follow-up    History of Present Illness:    Kim Wang is a 78 y.o. female past medical history significant for paroxysmal atrial fibrillation, she did have atrial fibrillation ablation done twice first time March 08, 2022 and then another ablation done in March of this year.  Additional problem include essential hypertension hyperlipidemia.  Comes today 2 months for follow-up overall doing well.  She checked her blood pressure and heart rate throughout the regular basis heart rate slightly elevated 70-80 blood pressure usually good there are some spikes of blood pressure being high.  No palpitations.  Overall doing well she described interesting episodes of some chills.  That happen a few times already.  She was placed on antibiotic before with improvement.  Otherwise is doing fine.  Past Medical History:  Diagnosis Date   Dyspnea on exertion 10/13/2021   Hyperlipidemia    on meds   Hypertension    on meds   Osteoporosis    bilateral hips   Paroxysmal atrial fibrillation (HCC) 10/13/2021    Past Surgical History:  Procedure Laterality Date   ATRIAL FIBRILLATION ABLATION N/A 03/08/2022   Procedure: ATRIAL FIBRILLATION ABLATION;  Surgeon: Inocencio Soyla Lunger, MD;  [facility]: [facility];  Service: Cardiovascular;  Laterality: N/A;   ATRIAL FIBRILLATION ABLATION N/A 07/26/2023   Procedure: ATRIAL FIBRILLATION ABLATION;  Surgeon: Inocencio Soyla Lunger, MD;  Location: [facility];  Service: Cardiovascular;  Laterality: N/A;   COLONOSCOPY  2012   RG-normal/tics- 10 yr recall   TUMOR REMOVAL Left    LEFT leg lymphoid tumor removed    Current Medications: Current Meds  Medication Sig   acetaminophen  (TYLENOL ) 500 MG tablet  Take 500 mg by mouth as needed for moderate pain (pain score 4-6).   acyclovir  ointment (ZOVIRAX ) 5 % Apply 1 Application topically every 3 (three) hours. (Patient taking differently: Apply 1 Application topically as needed.)   apixaban  (ELIQUIS ) 5 MG TABS tablet Take 1 tablet (5 mg total) by mouth 2 (two) times daily.   atorvastatin  (LIPITOR) 20 MG tablet Take 20 mg by mouth daily.   Calcium  Carbonate-Vitamin D 600-200 MG-UNIT TABS Take 2 tablets by mouth daily.   diltiazem  (CARDIZEM  CD) 240 MG 24 hr capsule Take 1 capsule (240 mg total) by mouth daily.   dronedarone  (MULTAQ ) 400 MG tablet Take 400 mg by mouth 2 (two) times daily with a meal.   ezetimibe (ZETIA) 10 MG tablet Take 10 mg by mouth daily.   metoprolol  tartrate (LOPRESSOR ) 25 MG tablet Take 12.5 mg by mouth daily as needed (Rapid heart rate over 100 bpm).   Omega 3 1200 MG CAPS Take 1,200 mg by mouth daily.     Allergies:   Flecainide , Tape, and Zinc   Social History   Socioeconomic History   Marital status: Married    Spouse name: Not on file   Number of children: Not on file   Years of education: Not on file   Highest education level: Not on file  Occupational History   Not on file  Tobacco Use   Smoking status: Never    Passive exposure: Past   Smokeless tobacco: Never  Vaping Use   Vaping  status: Never Used  Substance and Sexual Activity   Alcohol  use: Yes    Alcohol /week: 7.0 standard drinks of alcohol     Types: 7 Standard drinks or equivalent per week   Drug use: Never   Sexual activity: Yes  Other Topics Concern   Not on file  Social History Narrative   Not on file   Social Drivers of Health   Financial Resource Strain: Not on file  Food Insecurity: Not on file  Transportation Needs: Not on file  Physical Activity: Not on file  Stress: Not on file  Social Connections: Not on file     Family History: The patient's family history includes Breast cancer in her mother; Bronchitis in her father;  Chronic bronchitis in her father. There is no history of Colon polyps, Colon cancer, Esophageal cancer, Prostate cancer, Rectal cancer, or Stomach cancer. ROS:   Please see the history of present illness.    All 14 point review of systems negative except as described per history of present illness  EKGs/Labs/Other Studies Reviewed:         Recent Labs: 07/02/2023: BUN 17; Creatinine, Ser 0.96; Hemoglobin 13.3; Platelets 259; Potassium 4.4; Sodium 139  Recent Lipid Panel    Component Value Date/Time   CHOL 182 10/03/2022 1007   TRIG 99 10/03/2022 1007   HDL 71 10/03/2022 1007   CHOLHDL 2.6 10/03/2022 1007   LDLCALC 93 10/03/2022 1007    Physical Exam:    VS:  BP 122/80 (BP Location: Right Arm, Patient Position: Sitting)   Pulse 73   Ht 5' 4 (1.626 m)   Wt 156 lb (70.8 kg)   SpO2 97%   BMI 26.78 kg/m     Wt Readings from Last 3 Encounters:  11/12/23 156 lb (70.8 kg)  10/14/23 157 lb 9.6 oz (71.5 kg)  08/26/23 164 lb 12.8 oz (74.8 kg)     GEN:  Well nourished, well developed in no acute distress HEENT: Normal NECK: No JVD; No carotid bruits LYMPHATICS: No lymphadenopathy CARDIAC: RRR, no murmurs, no rubs, no gallops RESPIRATORY:  Clear to auscultation without rales, wheezing or rhonchi  ABDOMEN: Soft, non-tender, non-distended MUSCULOSKELETAL:  No edema; No deformity  SKIN: Warm and dry LOWER EXTREMITIES: no swelling NEUROLOGIC:  Alert and oriented x 3 PSYCHIATRIC:  Normal affect   ASSESSMENT:    1. Paroxysmal atrial fibrillation (HCC)   2. Primary hypertension   3. Mixed hyperlipidemia    PLAN:    In order of problems listed above:  Paroxysmal atrial fibrillation status post 2 ablations.  I will schedule her to have echocardiogram to look at left ventricle ejection fraction. Essential hypertension blood pressure well-controlled continue present management. Dyslipidemia I did review K PN which show me LDL 87 HDL 69 continue present management.  Episodes of  chills.  However she said when she checked her fever/temperature is always normal.  Will do echocardiogram ensure there is nothing happening with the heart.   Medication Adjustments/Labs and Tests Ordered: Current medicines are reviewed at length with the patient today.  Concerns regarding medicines are outlined above.  No orders of the defined types were placed in this encounter.  Medication changes: No orders of the defined types were placed in this encounter.   Signed, Lamar DOROTHA Fitch, MD, [facility] 11/12/2023 11:04 AM    [facility]

## 2023-11-12 NOTE — Patient Instructions (Signed)
 Medication Instructions:  Your physician recommends that you continue on your current medications as directed. Please refer to the Current Medication list given to you today.  *If you need a refill on your cardiac medications before your next appointment, please call your pharmacy*   Lab Work: None Ordered If you have labs (blood work) drawn today and your tests are completely normal, you will receive your results only by: MyChart Message (if you have MyChart) OR A paper copy in the mail If you have any lab test that is abnormal or we need to change your treatment, we will call you to review the results.   Testing/Procedures: Your physician has requested that you have an echocardiogram. Echocardiography is a painless test that uses sound waves to create images of your heart. It provides your doctor with information about the size and shape of your heart and how well your heart's chambers and valves are working. This procedure takes approximately one hour. There are no restrictions for this procedure. Please do NOT wear cologne, perfume, aftershave, or lotions (deodorant is allowed). Please arrive 15 minutes prior to your appointment time.  Please note: We ask at that you not bring children with you during ultrasound (echo/ vascular) testing. Due to room size and safety concerns, children are not allowed in the ultrasound rooms during exams. Our front office staff cannot provide observation of children in our lobby area while testing is being conducted. An adult accompanying a patient to their appointment will only be allowed in the ultrasound room at the discretion of the ultrasound technician under special circumstances. We apologize for any inconvenience.    Follow-Up: At [facility], you and your health needs are our priority.  As part of our continuing mission to provide you with exceptional heart care, we have created designated Provider Care Teams.  These Care Teams include your  primary Cardiologist (physician) and Advanced Practice Providers (APPs -  Physician Assistants and Nurse Practitioners) who all work together to provide you with the care you need, when you need it.  We recommend signing up for the patient portal called "MyChart".  Sign up information is provided on this After Visit Summary.  MyChart is used to connect with patients for Virtual Visits (Telemedicine).  Patients are able to view lab/test results, encounter notes, upcoming appointments, etc.  Non-urgent messages can be sent to your provider as well.   To learn more about what you can do with MyChart, go to [email or web address].    Your next appointment:   5 month(s)  The format for your next appointment:   In Person  Provider:   Gypsy Balsam, MD    Other Instructions NA

## 2023-11-26 DIAGNOSIS — Z1231 Encounter for screening mammogram for malignant neoplasm of breast: Secondary | ICD-10-CM | POA: Diagnosis not present

## 2023-11-26 DIAGNOSIS — E785 Hyperlipidemia, unspecified: Secondary | ICD-10-CM | POA: Diagnosis not present

## 2023-11-28 DIAGNOSIS — I129 Hypertensive chronic kidney disease with stage 1 through stage 4 chronic kidney disease, or unspecified chronic kidney disease: Secondary | ICD-10-CM | POA: Diagnosis not present

## 2023-11-29 ENCOUNTER — Ambulatory Visit: Attending: Cardiology

## 2023-11-29 DIAGNOSIS — R0609 Other forms of dyspnea: Secondary | ICD-10-CM

## 2023-12-01 LAB — ECHOCARDIOGRAM COMPLETE
AR max vel: 2.3 cm2
AV Area VTI: 2.37 cm2
AV Area mean vel: 2.24 cm2
AV Mean grad: 3.7 mmHg
AV Peak grad: 8.6 mmHg
Ao pk vel: 1.47 m/s
Area-P 1/2: 2.36 cm2
MV M vel: 3.41 m/s
MV Peak grad: 46.5 mmHg
MV VTI: 1.62 cm2
P 1/2 time: 453 ms
S' Lateral: 2.6 cm

## 2023-12-03 DIAGNOSIS — I4891 Unspecified atrial fibrillation: Secondary | ICD-10-CM | POA: Diagnosis not present

## 2023-12-03 DIAGNOSIS — N182 Chronic kidney disease, stage 2 (mild): Secondary | ICD-10-CM | POA: Diagnosis not present

## 2023-12-03 DIAGNOSIS — Z6827 Body mass index (BMI) 27.0-27.9, adult: Secondary | ICD-10-CM | POA: Diagnosis not present

## 2023-12-03 DIAGNOSIS — Z1231 Encounter for screening mammogram for malignant neoplasm of breast: Secondary | ICD-10-CM | POA: Diagnosis not present

## 2023-12-03 DIAGNOSIS — E785 Hyperlipidemia, unspecified: Secondary | ICD-10-CM | POA: Diagnosis not present

## 2023-12-03 DIAGNOSIS — I129 Hypertensive chronic kidney disease with stage 1 through stage 4 chronic kidney disease, or unspecified chronic kidney disease: Secondary | ICD-10-CM | POA: Diagnosis not present

## 2023-12-05 ENCOUNTER — Ambulatory Visit: Payer: Self-pay | Admitting: Cardiology

## 2023-12-06 ENCOUNTER — Telehealth: Payer: Self-pay

## 2023-12-06 NOTE — Telephone Encounter (Signed)
 Left message on My Chart with Echo results per Dr. Vanetta Shawl note. Routed to PCP.

## 2023-12-09 ENCOUNTER — Telehealth: Payer: Self-pay

## 2023-12-09 NOTE — Telephone Encounter (Signed)
Pt viewed results on My Chart per Dr. Krasowski's note. Routed to PCP.  

## 2023-12-24 ENCOUNTER — Telehealth: Payer: Self-pay | Admitting: Cardiology

## 2023-12-24 NOTE — Telephone Encounter (Signed)
 [facility] would like a copy of ECHO results from 8/7. Please advise.

## 2023-12-24 NOTE — Telephone Encounter (Signed)
 Echo sent to [facility] per request

## 2023-12-29 DIAGNOSIS — I129 Hypertensive chronic kidney disease with stage 1 through stage 4 chronic kidney disease, or unspecified chronic kidney disease: Secondary | ICD-10-CM | POA: Diagnosis not present

## 2024-01-28 DIAGNOSIS — I129 Hypertensive chronic kidney disease with stage 1 through stage 4 chronic kidney disease, or unspecified chronic kidney disease: Secondary | ICD-10-CM | POA: Diagnosis not present

## 2024-02-04 ENCOUNTER — Encounter: Payer: Self-pay | Admitting: Cardiology

## 2024-02-24 ENCOUNTER — Encounter: Payer: Self-pay | Admitting: Cardiology

## 2024-02-24 MED ORDER — DILTIAZEM HCL ER COATED BEADS 120 MG PO CP24: 120.0000 mg | ORAL_CAPSULE | Freq: Every day | ORAL | 3 refills | Status: DC

## 2024-02-24 NOTE — Addendum Note (Signed)
 Addended by: ONEITA BERLINER on: 02/24/2024 05:39 PM   Modules accepted: Orders

## 2024-02-28 DIAGNOSIS — I129 Hypertensive chronic kidney disease with stage 1 through stage 4 chronic kidney disease, or unspecified chronic kidney disease: Secondary | ICD-10-CM | POA: Diagnosis not present

## 2024-03-29 ENCOUNTER — Other Ambulatory Visit: Payer: Self-pay | Admitting: Cardiology

## 2024-03-29 DIAGNOSIS — I480 Paroxysmal atrial fibrillation: Secondary | ICD-10-CM

## 2024-03-29 DIAGNOSIS — I129 Hypertensive chronic kidney disease with stage 1 through stage 4 chronic kidney disease, or unspecified chronic kidney disease: Secondary | ICD-10-CM | POA: Diagnosis not present

## 2024-03-30 NOTE — Telephone Encounter (Signed)
 Prescription refill request for Eliquis  received. Indication:afib Last office visit:7/25 Scr: 0.96  3/25 Age:78 Weight:70.8  kg  Prescription refilled

## 2024-05-04 ENCOUNTER — Ambulatory Visit: Attending: Cardiology | Admitting: Cardiology

## 2024-05-04 ENCOUNTER — Encounter: Payer: Self-pay | Admitting: Cardiology

## 2024-05-04 VITALS — BP 130/80 | HR 81 | Ht 64.0 in | Wt 162.0 lb

## 2024-05-04 DIAGNOSIS — R0609 Other forms of dyspnea: Secondary | ICD-10-CM | POA: Diagnosis not present

## 2024-05-04 DIAGNOSIS — E782 Mixed hyperlipidemia: Secondary | ICD-10-CM | POA: Diagnosis not present

## 2024-05-04 DIAGNOSIS — I480 Paroxysmal atrial fibrillation: Secondary | ICD-10-CM | POA: Diagnosis not present

## 2024-05-04 DIAGNOSIS — I10 Essential (primary) hypertension: Secondary | ICD-10-CM

## 2024-05-04 MED ORDER — DILTIAZEM HCL ER COATED BEADS 240 MG PO CP24: 240.0000 mg | ORAL_CAPSULE | Freq: Every day | ORAL | 3 refills | Status: AC

## 2024-05-04 NOTE — Patient Instructions (Signed)
 Medication Instructions:  Your physician recommends that you continue on your current medications as directed. Please refer to the Current Medication list given to you today.  *If you need a refill on your cardiac medications before your next appointment, please call your pharmacy*   Lab Work: None Ordered If you have labs (blood work) drawn today and your tests are completely normal, you will receive your results only by: MyChart Message (if you have MyChart) OR A paper copy in the mail If you have any lab test that is abnormal or we need to change your treatment, we will call you to review the results.   Testing/Procedures: AUG Your physician has requested that you have an echocardiogram. Echocardiography is a painless test that uses sound waves to create images of your heart. It provides your doctor with information about the size and shape of your heart and how well your hearts chambers and valves are working. This procedure takes approximately one hour. There are no restrictions for this procedure. Please do NOT wear cologne, perfume, aftershave, or lotions (deodorant is allowed). Please arrive 15 minutes prior to your appointment time.  Please note: We ask at that you not bring children with you during ultrasound (echo/ vascular) testing. Due to room size and safety concerns, children are not allowed in the ultrasound rooms during exams. Our front office staff cannot provide observation of children in our lobby area while testing is being conducted. An adult accompanying a patient to their appointment will only be allowed in the ultrasound room at the discretion of the ultrasound technician under special circumstances. We apologize for any inconvenience.    Follow-Up: At [facility], you and your health needs are our priority.  As part of our continuing mission to provide you with exceptional heart care, we have created designated Provider Care Teams.  These Care Teams include your  primary Cardiologist (physician) and Advanced Practice Providers (APPs -  Physician Assistants and Nurse Practitioners) who all work together to provide you with the care you need, when you need it.  We recommend signing up for the patient portal called MyChart.  Sign up information is provided on this After Visit Summary.  MyChart is used to connect with patients for Virtual Visits (Telemedicine).  Patients are able to view lab/test results, encounter notes, upcoming appointments, etc.  Non-urgent messages can be sent to your provider as well.   To learn more about what you can do with MyChart, go to [email or web address].    Your next appointment:   8 month(s)  The format for your next appointment:   In Person  Provider:   Lamar Fitch, MD    Other Instructions NA

## 2024-05-04 NOTE — Progress Notes (Signed)
 " Cardiology Office Note:    Date:  05/04/2024   ID:  Kim Wang, [place] March 31, 1946, MRN [id number]  PCP:  Trinidad Hun, MD  Cardiologist:  Lamar Fitch, MD    Referring MD: Trinidad Hun, MD   Chief Complaint  Patient presents with   Follow-up  Doing fine  History of Present Illness:    Kim Wang is a 79 y.o. female past medical history significant for paroxysmal defibrillation she did have atrial fibrillation ablation done twice first, March 08, 2022 and then second ablation done in March 2025, additional problem include 6 essential hypertension, dyslipidemia, ascending aortic aneurysm measuring 42 mm.  Comes today to my office for follow-up overall doing well.  Denies have any chest pain tightness squeezing pressure burning chest.  Feels some palpitations sometimes but those are rare and short lasting, when she checked her heart rate on blood pressure monitor sometimes it is 90-95 she thinks it is Kim Wang too fast.  But otherwise doing well denies have any typical atrial fibrillation symptoms that she got before.  Denies have any chest pain tightness squeezing pressure in chest   Past Medical History:  Diagnosis Date   Dyspnea on exertion 10/13/2021   Hyperlipidemia    on meds   Hypertension    on meds   Osteoporosis    bilateral hips   Paroxysmal atrial fibrillation (HCC) 10/13/2021    Past Surgical History:  Procedure Laterality Date   ATRIAL FIBRILLATION ABLATION N/A 03/08/2022   Procedure: ATRIAL FIBRILLATION ABLATION;  Surgeon: Inocencio Soyla Lunger, MD;  Location: [facility];  Service: Cardiovascular;  Laterality: N/A;   ATRIAL FIBRILLATION ABLATION N/A 07/26/2023   Procedure: ATRIAL FIBRILLATION ABLATION;  Surgeon: Inocencio Soyla Lunger, MD;  Location: [facility];  Service: Cardiovascular;  Laterality: N/A;   COLONOSCOPY  2012   RG-normal/tics- 10 yr recall   TUMOR REMOVAL Left    LEFT leg lymphoid tumor removed    Current  Medications: Active Medications[1]   Allergies:   Flecainide , Tape, and Zinc   Social History   Socioeconomic History   Marital status: Married    Spouse name: Not on file   Number of children: Not on file   Years of education: Not on file   Highest education level: Not on file  Occupational History   Not on file  Tobacco Use   Smoking status: Never    Passive exposure: Past   Smokeless tobacco: Never  Vaping Use   Vaping status: Never Used  Substance and Sexual Activity   Alcohol  use: Yes    Alcohol /week: 7.0 standard drinks of alcohol     Types: 7 Standard drinks or equivalent per week   Drug use: Never   Sexual activity: Yes  Other Topics Concern   Not on file  Social History Narrative   Not on file   Social Drivers of Health   Tobacco Use: Low Risk (05/04/2024)   Patient History    Smoking Tobacco Use: Never    Smokeless Tobacco Use: Never    Passive Exposure: Past  Financial Resource Strain: Not on file  Food Insecurity: Not on file  Transportation Needs: Not on file  Physical Activity: Not on file  Stress: Not on file  Social Connections: Not on file  Depression (EYV7-0): Not on file  Alcohol  Screen: Not on file  Housing: Not on file  Utilities: Not on file  Health Literacy: Not on file     Family History: The patient's family  history includes Breast cancer in her mother; Bronchitis in her father; Chronic bronchitis in her father. There is no history of Colon polyps, Colon cancer, Esophageal cancer, Prostate cancer, Rectal cancer, or Stomach cancer. ROS:   Please see the history of present illness.    All 14 point review of systems negative except as described per history of present illness  EKGs/Labs/Other Studies Reviewed:    EKG Interpretation Date/Time:  Monday May 04 2024 11:26:58 EST Ventricular Rate:  81 PR Interval:  144 QRS Duration:  72 QT Interval:  384 QTC Calculation: 446 R Axis:   58  Text Interpretation: Normal sinus rhythm  Normal ECG When compared with ECG of 14-Oct-2023 15:32, ST no longer depressed in Inferior leads T wave inversion no longer evident in Inferior leads T wave inversion no longer evident in Anterolateral leads QT has shortened Confirmed by Bernie [place] [phone]) on 05/04/2024 11:41:36 AM    Recent Labs: 07/02/2023: BUN 17; Creatinine, Ser 0.96; Hemoglobin 13.3; Platelets 259; Potassium 4.4; Sodium 139  Recent Lipid Panel    Component Value Date/Time   CHOL 182 10/03/2022 1007   TRIG 99 10/03/2022 1007   HDL 71 10/03/2022 1007   CHOLHDL 2.6 10/03/2022 1007   LDLCALC 93 10/03/2022 1007    Physical Exam:    VS:  BP 130/80   Pulse 81   Ht 5' 4 (1.626 m)   Wt 162 lb (73.5 kg)   SpO2 98%   BMI 27.81 kg/m     Wt Readings from Last 3 Encounters:  05/04/24 162 lb (73.5 kg)  11/12/23 156 lb (70.8 kg)  10/14/23 157 lb 9.6 oz (71.5 kg)     GEN:  Well nourished, well developed in no acute distress HEENT: Normal NECK: No JVD; No carotid bruits LYMPHATICS: No lymphadenopathy CARDIAC: RRR, no murmurs, no rubs, no gallops RESPIRATORY:  Clear to auscultation without rales, wheezing or rhonchi  ABDOMEN: Soft, non-tender, non-distended MUSCULOSKELETAL:  No edema; No deformity  SKIN: Warm and dry LOWER EXTREMITIES: no swelling NEUROLOGIC:  Alert and oriented x 3 PSYCHIATRIC:  Normal affect   ASSESSMENT:    1. Paroxysmal atrial fibrillation (HCC)   2. Primary hypertension   3. Mixed hyperlipidemia   4. Dyspnea on exertion    PLAN:    In order of problems listed above:  Paroxysmal atrial fibrillation: Continue anticoagulation.  She does have some symptoms advised him to get new Kardia mobile device although does not work and the record anytime she feel palpitations. Essential hypertension: Blood pressure well-controlled continue present management. Mixed dyslipidemia stable, she is taking Zetia as well as Lipitor 20 which I will continue, I did review KPN which show me her LDL 84 HDL  77 we will continue present management. Dyspnea on exertion stable   Medication Adjustments/Labs and Tests Ordered: Current medicines are reviewed at length with the patient today.  Concerns regarding medicines are outlined above.  Orders Placed This Encounter  Procedures   EKG 12-Lead   Medication changes: No orders of the defined types were placed in this encounter.   Signed, [place] DOROTHA Bernie, MD, [facility] 05/04/2024 12:05 PM    [facility]     [1]  Current Meds  Medication Sig   acetaminophen  (TYLENOL ) 500 MG tablet Take 500 mg by mouth as needed for moderate pain (pain score 4-6).   acyclovir  ointment (ZOVIRAX ) 5 % Apply 1 Application topically every 3 (three) hours. (Patient taking differently: Apply 1 Application topically as needed.)  atorvastatin  (LIPITOR) 20 MG tablet Take 20 mg by mouth daily.   Calcium  Carbonate-Vitamin D 600-200 MG-UNIT TABS Take 2 tablets by mouth daily.   diltiazem  (CARDIZEM  CD) 240 MG 24 hr capsule Take 240 mg by mouth daily.   dronedarone  (MULTAQ ) 400 MG tablet Take 400 mg by mouth 2 (two) times daily with a meal.   ELIQUIS  5 MG TABS tablet TAKE 1 TABLET BY MOUTH TWICE A DAY   ezetimibe (ZETIA) 10 MG tablet Take 10 mg by mouth daily.   metoprolol  tartrate (LOPRESSOR ) 25 MG tablet Take 12.5 mg by mouth daily as needed (Rapid heart rate over 100 bpm).   Omega 3 1200 MG CAPS Take 1,200 mg by mouth daily.   "
